# Patient Record
Sex: Female | Born: 2000
Health system: Southern US, Community
[De-identification: ages and names within clinical notes are randomized; demographics above are authoritative.]

## PROBLEM LIST (undated history)

## (undated) DIAGNOSIS — J45909 Unspecified asthma, uncomplicated: Secondary | ICD-10-CM

## (undated) DIAGNOSIS — K5900 Constipation, unspecified: Secondary | ICD-10-CM

---

## 2019-12-28 ENCOUNTER — Ambulatory Visit (HOSPITAL_COMMUNITY)
Admission: EM | Admit: 2019-12-28 | Discharge: 2019-12-28 | Disposition: A | Discharge status: Home / Self Care | Payer: HRSA Program | Attending: Family Medicine | Admitting: Family Medicine

## 2019-12-28 ENCOUNTER — Encounter (HOSPITAL_COMMUNITY): Payer: Self-pay

## 2019-12-28 ENCOUNTER — Other Ambulatory Visit: Payer: Self-pay

## 2019-12-28 DIAGNOSIS — Z20822 Contact with and (suspected) exposure to covid-19: Secondary | ICD-10-CM | POA: Insufficient documentation

## 2019-12-28 DIAGNOSIS — J029 Acute pharyngitis, unspecified: Secondary | ICD-10-CM | POA: Diagnosis not present

## 2019-12-28 DIAGNOSIS — J069 Acute upper respiratory infection, unspecified: Secondary | ICD-10-CM | POA: Diagnosis not present

## 2019-12-28 DIAGNOSIS — R059 Cough, unspecified: Secondary | ICD-10-CM | POA: Diagnosis present

## 2019-12-28 DIAGNOSIS — J3089 Other allergic rhinitis: Secondary | ICD-10-CM | POA: Diagnosis not present

## 2019-12-28 DIAGNOSIS — Z79899 Other long term (current) drug therapy: Secondary | ICD-10-CM | POA: Diagnosis not present

## 2019-12-28 HISTORY — DX: Unspecified asthma, uncomplicated: J45.909

## 2019-12-28 LAB — SARS CORONAVIRUS 2 (TAT 6-24 HRS): SARS Coronavirus 2: NEGATIVE

## 2019-12-28 MED ORDER — FLUTICASONE PROPIONATE 50 MCG/ACT NA SUSP: 1.0000 | Freq: Two times a day (BID) | NASAL | 2 refills | Status: DC

## 2019-12-28 MED ORDER — CETIRIZINE HCL 10 MG PO TABS: 10.0000 mg | ORAL_TABLET | Freq: Every day | ORAL | 1 refills | Status: DC

## 2019-12-28 NOTE — ED Triage Notes (Signed)
Pt in with c/o dry cough and itchy throat that started 2 days ago.   Has not had any meds for sxs  Denies any fever, n/v, diarrhea, sob, headaches

## 2019-12-31 NOTE — ED Provider Notes (Signed)
[facility]    CSN: [id number] Arrival date & time: 12/28/19  1229      History   Chief Complaint Chief Complaint  Patient presents with  . Cough  . Sore Throat    HPI Rebecca Griffin is a 19 y.o. female.   Patient presenting today with 2 day hx of dry cough, itchy scratchy throat, rhinorrhea. Denies fever, chills, CP, SOB, abdominal pain, N/V/D. Not taking anything OTC for sxs currently. Hx of allergic rhinitis and has been out of her antihistamine and flonase for several weeks. No known sick contacts, recent travel.      Past Medical History:  Diagnosis Date  . Asthma     There are no problems to display for this patient.   History reviewed. No pertinent surgical history.  OB History   No obstetric history on file.      Home Medications    Prior to Admission medications   Medication Sig Start Date End Date Taking? Authorizing Provider  ferrous sulfate 325 (65 FE) MG tablet Take by mouth. 06/10/19 06/09/20 Yes [provider]  norelgestromin-ethinyl estradiol [facility]) 150-35 MCG/24HR transdermal patch APPLY 1 PATCH ONCE A WEEK 10/25/19  Yes [provider]  cetirizine (ZYRTEC ALLERGY) 10 MG tablet Take 1 tablet (10 mg total) by mouth daily. 12/28/19   Particia Nearing, PA-C  fluticasone [facility]) 50 MCG/ACT nasal spray Place 1 spray into both nostrils 2 (two) times daily. 12/28/19   Particia Nearing, PA-C    Family History Family History  Problem Relation Age of Onset  . Hypertension Mother     Social History Social History   Tobacco Use  . Smoking status: Never Smoker  . Smokeless tobacco: Never Used  Vaping Use  . Vaping Use: Never used  Substance Use Topics  . Alcohol use: Never  . Drug use: Never     Allergies   Fd&c red #40 [red dye] and Lactose intolerance (gi)   Review of Systems Review of Systems PER HPI    Physical Exam Triage Vital Signs ED Triage Vitals  Enc Vitals Group     BP  12/28/19 1345 129/73     Pulse Rate 12/28/19 1345 79     Resp 12/28/19 1345 20     Temp 12/28/19 1345 98.8 F (37.1 C)     Temp Source 12/28/19 1345 Oral     SpO2 12/28/19 1345 96 %     Weight --      Height --      Head Circumference --      Peak Flow --      Pain Score 12/28/19 1340 5     Pain Loc --      Pain Edu? --      Excl. in GC? --    No data found.  Updated Vital Signs BP 129/73 (BP Location: Right Arm)   Pulse 79   Temp 98.8 F (37.1 C) (Oral)   Resp 20   LMP 12/23/2019 (Approximate)   SpO2 96%   Visual Acuity Right Eye Distance:   Left Eye Distance:   Bilateral Distance:    Right Eye Near:   Left Eye Near:    Bilateral Near:     Physical Exam Vitals and nursing note reviewed.  Constitutional:      Appearance: Normal appearance. She is well-developed. She is not ill-appearing.  HENT:     Head: Atraumatic.     Right Ear: Tympanic membrane normal.  Left Ear: Tympanic membrane normal.     Nose: Rhinorrhea present.     Mouth/Throat:     Mouth: Mucous membranes are moist.     Pharynx: Posterior oropharyngeal erythema present.  Eyes:     Extraocular Movements: Extraocular movements intact.     Conjunctiva/sclera: Conjunctivae normal.  Cardiovascular:     Rate and Rhythm: Normal rate and regular rhythm.     Heart sounds: Normal heart sounds.  Pulmonary:     Effort: Pulmonary effort is normal.     Breath sounds: Normal breath sounds. No wheezing or rales.  Abdominal:     General: Bowel sounds are normal. There is no distension.     Palpations: Abdomen is soft.     Tenderness: There is no abdominal tenderness. There is no guarding.  Musculoskeletal:        General: Normal range of motion.     Cervical back: Normal range of motion and neck supple.  Skin:    General: Skin is warm and dry.  Neurological:     Mental Status: She is alert and oriented to person, place, and time.  Psychiatric:        Mood and Affect: Mood normal.        Thought  Content: Thought content normal.        Judgment: Judgment normal.      UC Treatments / Results  Labs (all labs ordered are listed, but only abnormal results are displayed) Labs Reviewed  SARS CORONAVIRUS 2 (TAT 6-24 HRS)    EKG   Radiology No results found.  Procedures Procedures (including critical care time)  Medications Ordered in UC Medications - No data to display  Initial Impression / Assessment and Plan / UC Course  I have reviewed the triage vital signs and the nursing notes.  Pertinent labs & imaging results that were available during my care of the patient were reviewed by me and considered in my medical decision making (see chart for details).     Suspect allergic rhinitis exacerbation, but will test for COVID for r/o. Isolate until results return and sxs improved. Restart zyrtec and flonase regimen, supportive home care reviewed. Return if worsening sxs.  Final Clinical Impressions(s) / UC Diagnoses   Final diagnoses:  Viral URI with cough  Seasonal allergic rhinitis due to other allergic trigger   Discharge Instructions   None    ED Prescriptions    Medication Sig Dispense Auth. Provider   cetirizine (ZYRTEC ALLERGY) 10 MG tablet Take 1 tablet (10 mg total) by mouth daily. 30 tablet Particia Nearing, PA-C   fluticasone [facility]) 50 MCG/ACT nasal spray Place 1 spray into both nostrils 2 (two) times daily. 16 g Particia Nearing, [place]     PDMP not reviewed this encounter.   Roosvelt Maser Hunter, [place] 12/31/19 [phone]

## 2020-03-18 ENCOUNTER — Other Ambulatory Visit: Payer: Self-pay | Admitting: Family Medicine

## 2020-03-21 DIAGNOSIS — E611 Iron deficiency: Secondary | ICD-10-CM | POA: Diagnosis not present

## 2020-03-21 DIAGNOSIS — Z131 Encounter for screening for diabetes mellitus: Secondary | ICD-10-CM | POA: Diagnosis not present

## 2020-03-21 DIAGNOSIS — L2082 Flexural eczema: Secondary | ICD-10-CM | POA: Diagnosis not present

## 2020-03-21 DIAGNOSIS — Z124 Encounter for screening for malignant neoplasm of cervix: Secondary | ICD-10-CM | POA: Diagnosis not present

## 2020-03-21 DIAGNOSIS — Z113 Encounter for screening for infections with a predominantly sexual mode of transmission: Secondary | ICD-10-CM | POA: Diagnosis not present

## 2020-03-21 DIAGNOSIS — Z0000 Encounter for general adult medical examination without abnormal findings: Secondary | ICD-10-CM | POA: Diagnosis not present

## 2020-03-21 DIAGNOSIS — Z13220 Encounter for screening for lipoid disorders: Secondary | ICD-10-CM | POA: Diagnosis not present

## 2020-03-29 DIAGNOSIS — N9089 Other specified noninflammatory disorders of vulva and perineum: Secondary | ICD-10-CM | POA: Diagnosis not present

## 2020-04-09 ENCOUNTER — Ambulatory Visit (INDEPENDENT_AMBULATORY_CARE_PROVIDER_SITE_OTHER): Payer: 59

## 2020-04-09 ENCOUNTER — Ambulatory Visit (HOSPITAL_COMMUNITY)
Admission: EM | Admit: 2020-04-09 | Discharge: 2020-04-09 | Disposition: A | Discharge status: Home / Self Care | Payer: 59 | Attending: Emergency Medicine | Admitting: Emergency Medicine

## 2020-04-09 ENCOUNTER — Encounter (HOSPITAL_COMMUNITY): Payer: Self-pay

## 2020-04-09 ENCOUNTER — Other Ambulatory Visit: Payer: Self-pay

## 2020-04-09 DIAGNOSIS — R109 Unspecified abdominal pain: Secondary | ICD-10-CM | POA: Diagnosis not present

## 2020-04-09 DIAGNOSIS — K5900 Constipation, unspecified: Secondary | ICD-10-CM

## 2020-04-09 DIAGNOSIS — R197 Diarrhea, unspecified: Secondary | ICD-10-CM | POA: Diagnosis not present

## 2020-04-09 DIAGNOSIS — R110 Nausea: Secondary | ICD-10-CM | POA: Diagnosis not present

## 2020-04-09 LAB — POCT URINALYSIS DIPSTICK, ED / UC
Glucose, UA: NEGATIVE mg/dL
Hgb urine dipstick: NEGATIVE
Leukocytes,Ua: NEGATIVE
Nitrite: NEGATIVE
Protein, ur: 30 mg/dL — AB
Specific Gravity, Urine: 1.025 (ref 1.005–1.030)
Urobilinogen, UA: 1 mg/dL (ref 0.0–1.0)
pH: 7 (ref 5.0–8.0)

## 2020-04-09 MED ORDER — POLYETHYLENE GLYCOL 3350 17 G PO PACK: 17.0000 g | PACK | Freq: Every day | ORAL | 0 refills | Status: DC

## 2020-04-09 NOTE — ED Triage Notes (Signed)
Pt present diarrhea and lower abdominal pain. Symptoms started yesterday. Pt states she had some mild nausea yesterday but it no gone.

## 2020-04-09 NOTE — ED Provider Notes (Signed)
[facility]    CSN: [id number] Arrival date & time: 04/09/20  1502      History   Chief Complaint Chief Complaint  Patient presents with  . Diarrhea  . Abdominal Pain    HPI Rebecca Griffin is a 20 y.o. female.   HPI  Diarrhea: Pt is a slightly poor historian. Pt presents today with diarrhea and lower abdominal pain since yesterday.  She also has some associated nausea but this has resolved as of today.  She states that she has chronic constipation that is fairly severe.  She reports that when she has bowel movements her bowel movements come out as "balls ".  She has not followed or seen for this by a PCP or specialist.  She states that recently she has tried greatly increasing her fiber intake to help with her constipation.  Shortly after this she developed her symptoms.  She states that every time she eats she has a bout of diarrhea.  Diarrhea is nonbloody.  She states that her abdominal pain occurs throughout her abdomen.  She reports that she had a small bowel movement on Friday but is unsure when she had her bowel movement prior to this.  She has not had any fevers, vomiting, urinary symptoms.  Last menstrual cycle was 03/16/2020.    Past Medical History:  Diagnosis Date  . Asthma     There are no problems to display for this patient.   History reviewed. No pertinent surgical history.  OB History   No obstetric history on file.      Home Medications    Prior to Admission medications   Medication Sig Start Date End Date Taking? Authorizing Provider  cetirizine (ZYRTEC ALLERGY) 10 MG tablet Take 1 tablet (10 mg total) by mouth daily. 12/28/19   Particia Nearing, PA-C  ferrous sulfate 325 (65 FE) MG tablet Take by mouth. 06/10/19 06/09/20  [provider]  fluticasone (FLONASE) 50 MCG/ACT nasal spray Place 1 spray into both nostrils 2 (two) times daily. 12/28/19   Particia Nearing, PA-C  norelgestromin-ethinyl estradiol [facility])  150-35 MCG/24HR transdermal patch APPLY 1 PATCH ONCE A WEEK 10/25/19   [provider]    Family History Family History  Problem Relation Age of Onset  . Hypertension Mother     Social History Social History   Tobacco Use  . Smoking status: Never Smoker  . Smokeless tobacco: Never Used  Vaping Use  . Vaping Use: Never used  Substance Use Topics  . Alcohol use: Never  . Drug use: Never     Allergies   Fd&c red #40 [red dye] and Lactose intolerance (gi)   Review of Systems Review of Systems  As stated above in HPI Physical Exam Triage Vital Signs ED Triage Vitals [04/09/20 1553]  Enc Vitals Group     BP 119/66     Pulse Rate 87     Resp 16     Temp 98.2 F (36.8 C)     Temp Source Oral     SpO2 100 %     Weight      Height      Head Circumference      Peak Flow      Pain Score 4     Pain Loc      Pain Edu?      Excl. in GC?    No data found.  Updated Vital Signs BP 119/66 (BP Location: Left Arm)   Pulse  87   Temp 98.2 F (36.8 C) (Oral)   Resp 16   SpO2 100%   Physical Exam Vitals and nursing note reviewed.  Cardiovascular:     Rate and Rhythm: Normal rate and regular rhythm.     Heart sounds: Normal heart sounds.  Pulmonary:     Breath sounds: Normal breath sounds.  Abdominal:     General: Abdomen is flat. Bowel sounds are normal. There is no distension.     Palpations: Abdomen is soft. There is mass (palpable stool burden throughout). There is no hepatomegaly or splenomegaly.     Tenderness: There is no abdominal tenderness. There is no right CVA tenderness, left CVA tenderness, guarding or rebound. Negative signs include Murphy's sign and McBurney's sign.     Hernia: No hernia is present.  Skin:    General: Skin is warm and dry.      UC Treatments / Results  Labs (all labs ordered are listed, but only abnormal results are displayed) Labs Reviewed  POCT URINALYSIS DIPSTICK, ED / UC - Abnormal; Notable for the following  components:      Result Value   Bilirubin Urine SMALL (*)    Ketones, ur TRACE (*)    Protein, ur 30 (*)    All other components within normal limits    EKG   Radiology No results found.  Procedures Procedures (including critical care time)  Medications Ordered in UC Medications - No data to display  Initial Impression / Assessment and Plan / UC Course  I have reviewed the triage vital signs and the nursing notes.  Pertinent labs & imaging results that were available during my care of the patient were reviewed by me and considered in my medical decision making (see chart for details).     New. Likely related to stool burden and chronic constipation with acute change in diet creating increased gas and diarrhea.  I discussed with patient how diarrhea is possible even if constipation is visualized.  KUB pending. We also discussed that she will need to be seen by a primary care provider to evaluate her chronic constipation and will likely need a referral to GI from them.  In the meantime we discussed MiraLAX and stool softener.   Final Clinical Impressions(s) / UC Diagnoses   Final diagnoses:  Diarrhea, unspecified type  Constipation, unspecified constipation type   Discharge Instructions   None    ED Prescriptions    None     PDMP not reviewed this encounter.   Rushie Chestnut, [place] 04/09/20 1703

## 2020-05-18 DIAGNOSIS — R102 Pelvic and perineal pain: Secondary | ICD-10-CM | POA: Diagnosis not present

## 2020-05-25 ENCOUNTER — Other Ambulatory Visit: Payer: Self-pay

## 2020-05-25 ENCOUNTER — Encounter (HOSPITAL_COMMUNITY): Payer: Self-pay

## 2020-05-25 ENCOUNTER — Ambulatory Visit (HOSPITAL_COMMUNITY)
Admission: EM | Admit: 2020-05-25 | Discharge: 2020-05-25 | Disposition: A | Discharge status: Home / Self Care | Payer: 59 | Attending: Family Medicine | Admitting: Family Medicine

## 2020-05-25 DIAGNOSIS — K921 Melena: Secondary | ICD-10-CM | POA: Diagnosis not present

## 2020-05-25 DIAGNOSIS — R109 Unspecified abdominal pain: Secondary | ICD-10-CM | POA: Diagnosis not present

## 2020-05-25 DIAGNOSIS — K5900 Constipation, unspecified: Secondary | ICD-10-CM | POA: Diagnosis not present

## 2020-05-25 HISTORY — DX: Constipation, unspecified: K59.00

## 2020-05-25 MED ORDER — DOCUSATE SODIUM 100 MG PO CAPS: 100.0000 mg | ORAL_CAPSULE | Freq: Two times a day (BID) | ORAL | 0 refills | Status: DC

## 2020-05-25 NOTE — ED Triage Notes (Signed)
Patient presents to Urgent Care with complaints of one episode of blood in stool once last week.  Pt c/o of abdominal cramping and nausea since last week. Pt states she has a hx of constipation and was prescribed Miralax for relief. She states she is having continued constipation.   Denies vomiting.

## 2020-05-25 NOTE — ED Provider Notes (Signed)
[facility]    CSN: [id number] Arrival date & time: 05/25/20  1448      History   Chief Complaint Chief Complaint  Patient presents with  . Blood In Stools    HPI Rebecca Griffin is a 20 y.o. female.   Patient here today with concern over an episode of bright red blood with bowel movement that happened almost a week ago.  She has had some off-and-on abdominal cramping since but overall feeling back to baseline after this 1 episode.  She states the episode occurred after she tried MiraLAX for the first time.  Has an extensive history of fairly severe constipation per her account and has tried numerous supplements and medications without lasting relief for this.  She also has chronic abdominal pain but has never seen a GI specialist for this and has not seen PCP for this in quite some time.  Denies fever, chills, vomiting, unexpected weight loss, dysuria, hematuria, history of hemorrhoids or other anorectal abnormalities.  Has not been trying anything since the MiraLAX which caused the bleeding episode.  She states the bleeding resolved after wiping.     Past Medical History:  Diagnosis Date  . Asthma   . Constipation     There are no problems to display for this patient.   History reviewed. No pertinent surgical history.  OB History   No obstetric history on file.      Home Medications    Prior to Admission medications   Medication Sig Start Date End Date Taking? Authorizing Provider  docusate sodium (COLACE) 100 MG capsule Take 1 capsule (100 mg total) by mouth every 12 (twelve) hours. 05/25/20  Yes Particia Nearing, PA-C  cetirizine (ZYRTEC ALLERGY) 10 MG tablet Take 1 tablet (10 mg total) by mouth daily. 12/28/19   Particia Nearing, PA-C  ferrous sulfate 325 (65 FE) MG tablet Take by mouth. 06/10/19 06/09/20  [provider]  fluticasone (FLONASE) 50 MCG/ACT nasal spray Place 1 spray into both nostrils 2 (two) times daily. 12/28/19    Particia Nearing, PA-C  norelgestromin-ethinyl estradiol [facility]) 150-35 MCG/24HR transdermal patch APPLY 1 PATCH ONCE A WEEK 10/25/19   [provider]  polyethylene glycol (MIRALAX MIX-IN PAX) 17 g packet Take 17 g by mouth daily. 04/09/20   Rushie Chestnut, PA-C    Family History Family History  Problem Relation Age of Onset  . Hypertension Mother     Social History Social History   Tobacco Use  . Smoking status: Never Smoker  . Smokeless tobacco: Never Used  Vaping Use  . Vaping Use: Never used  Substance Use Topics  . Alcohol use: Never  . Drug use: Never     Allergies   Fd&c red #40 [red dye] and Lactose intolerance (gi)   Review of Systems Review of Systems Per HPI  Physical Exam Triage Vital Signs ED Triage Vitals  Enc Vitals Group     BP 05/25/20 1512 106/71     Pulse Rate 05/25/20 1512 84     Resp 05/25/20 1512 16     Temp 05/25/20 1512 98.4 F (36.9 C)     Temp Source 05/25/20 1512 Oral     SpO2 05/25/20 1512 98 %     Weight --      Height --      Head Circumference --      Peak Flow --      Pain Score 05/25/20 1557 0  Pain Loc --      Pain Edu? --      Excl. in GC? --    No data found.  Updated Vital Signs BP 106/71 (BP Location: Left Arm)   Pulse 84   Temp 98.4 F (36.9 C) (Oral)   Resp 16   LMP 05/11/2020   SpO2 98%   Visual Acuity Right Eye Distance:   Left Eye Distance:   Bilateral Distance:    Right Eye Near:   Left Eye Near:    Bilateral Near:     Physical Exam Vitals and nursing note reviewed.  Constitutional:      Appearance: Normal appearance. She is not ill-appearing.  HENT:     Head: Atraumatic.  Eyes:     Extraocular Movements: Extraocular movements intact.     Conjunctiva/sclera: Conjunctivae normal.  Cardiovascular:     Rate and Rhythm: Normal rate and regular rhythm.     Heart sounds: Normal heart sounds.  Pulmonary:     Effort: Pulmonary effort is normal.     Breath sounds: Normal  breath sounds.  Abdominal:     General: Bowel sounds are normal. There is no distension.     Palpations: Abdomen is soft. There is no mass.     Tenderness: There is abdominal tenderness. There is no right CVA tenderness, left CVA tenderness, guarding or rebound.     Comments: Mild tenderness palpation epigastric and suprapubic abdominal areas  Genitourinary:    Comments: Anorectal exam declined today as she states there are no recent abnormalities since one episode of bleeding last week Musculoskeletal:        General: Normal range of motion.     Cervical back: Normal range of motion and neck supple.  Skin:    General: Skin is warm and dry.  Neurological:     Mental Status: She is alert and oriented to person, place, and time.  Psychiatric:        Mood and Affect: Mood normal.        Thought Content: Thought content normal.        Judgment: Judgment normal.      UC Treatments / Results  Labs (all labs ordered are listed, but only abnormal results are displayed) Labs Reviewed - No data to display  EKG   Radiology No results found.  Procedures Procedures (including critical care time)  Medications Ordered in UC Medications - No data to display  Initial Impression / Assessment and Plan / UC Course  I have reviewed the triage vital signs and the nursing notes.  Pertinent labs & imaging results that were available during my care of the patient were reviewed by me and considered in my medical decision making (see chart for details).     Patient has been back to baseline since 1 episode of bright red blood per rectum following a bowel movement/constipation when she tried MiraLAX for the first time.  Continue avoidance of MiraLAX at this point until further evaluation has been done and to her chronic issues.  Suspect inflamed internal hemorrhoids from her chronic constipation causing this 1 episode of bleeding.  May trial Colace, fiber, probiotics, increase fluid intake and  discussed importance of follow-up with PCP and GI specialist for further evaluation and to her ongoing chronic symptoms.  Return to urgent care or ED for acutely worsening symptoms at any time.  Work note given.  Final Clinical Impressions(s) / UC Diagnoses   Final diagnoses:  Constipation, unspecified constipation type  Blood in stool  Abdominal cramping     Discharge Instructions     Follow-up with your primary care provider as soon as you are able to for further investigation into your chronic constipation issues    ED Prescriptions    Medication Sig Dispense Auth. Provider   docusate sodium (COLACE) 100 MG capsule Take 1 capsule (100 mg total) by mouth every 12 (twelve) hours. 60 capsule Particia Nearing, [place]     PDMP not reviewed this encounter.   Particia Nearing, [place] 05/25/20 [phone]

## 2020-05-25 NOTE — Discharge Instructions (Signed)
Follow-up with your primary care provider as soon as you are able to for further investigation into your chronic constipation issues

## 2020-05-27 ENCOUNTER — Emergency Department (HOSPITAL_BASED_OUTPATIENT_CLINIC_OR_DEPARTMENT_OTHER)
Admission: EM | Admit: 2020-05-27 | Discharge: 2020-05-27 | Disposition: A | Discharge status: Home / Self Care | Payer: 59 | Attending: Emergency Medicine | Admitting: Emergency Medicine

## 2020-05-27 ENCOUNTER — Encounter (HOSPITAL_BASED_OUTPATIENT_CLINIC_OR_DEPARTMENT_OTHER): Payer: Self-pay | Admitting: Emergency Medicine

## 2020-05-27 ENCOUNTER — Other Ambulatory Visit: Payer: Self-pay

## 2020-05-27 ENCOUNTER — Emergency Department (HOSPITAL_BASED_OUTPATIENT_CLINIC_OR_DEPARTMENT_OTHER): Payer: 59

## 2020-05-27 DIAGNOSIS — K625 Hemorrhage of anus and rectum: Secondary | ICD-10-CM | POA: Insufficient documentation

## 2020-05-27 DIAGNOSIS — R109 Unspecified abdominal pain: Secondary | ICD-10-CM | POA: Diagnosis not present

## 2020-05-27 DIAGNOSIS — R1030 Lower abdominal pain, unspecified: Secondary | ICD-10-CM | POA: Insufficient documentation

## 2020-05-27 DIAGNOSIS — B9689 Other specified bacterial agents as the cause of diseases classified elsewhere: Secondary | ICD-10-CM

## 2020-05-27 DIAGNOSIS — Z8719 Personal history of other diseases of the digestive system: Secondary | ICD-10-CM | POA: Diagnosis not present

## 2020-05-27 DIAGNOSIS — J45909 Unspecified asthma, uncomplicated: Secondary | ICD-10-CM | POA: Diagnosis not present

## 2020-05-27 LAB — COMPREHENSIVE METABOLIC PANEL
ALT: 8 U/L (ref 0–44)
AST: 19 U/L (ref 15–41)
Albumin: 4.4 g/dL (ref 3.5–5.0)
Alkaline Phosphatase: 52 U/L (ref 38–126)
Anion gap: 10 (ref 5–15)
BUN: 9 mg/dL (ref 6–20)
CO2: 23 mmol/L (ref 22–32)
Calcium: 9.3 mg/dL (ref 8.9–10.3)
Chloride: 104 mmol/L (ref 98–111)
Creatinine, Ser: 0.57 mg/dL (ref 0.44–1.00)
GFR, Estimated: >60 mL/min (ref >60)
Glucose, Bld: 86 mg/dL (ref 70–99)
Potassium: 5.4 mmol/L — ABNORMAL HIGH (ref 3.5–5.1)
Sodium: 137 mmol/L (ref 135–145)
Total Bilirubin: 0.5 mg/dL (ref 0.3–1.2)
Total Protein: 7.7 g/dL (ref 6.5–8.1)

## 2020-05-27 LAB — CBC WITH DIFFERENTIAL/PLATELET
Abs Immature Granulocytes: 0.01 10*3/uL (ref 0.00–0.07)
Basophils Absolute: 0.1 10*3/uL (ref 0.0–0.1)
Basophils Relative: 2 %
Eosinophils Absolute: 0.3 10*3/uL (ref 0.0–0.5)
Eosinophils Relative: 6 %
HCT: 40.5 % (ref 36.0–46.0)
Hemoglobin: 13.7 g/dL (ref 12.0–15.0)
Immature Granulocytes: 0 %
Lymphocytes Relative: 51 %
Lymphs Abs: 2.6 10*3/uL (ref 0.7–4.0)
MCH: 28.4 pg (ref 26.0–34.0)
MCHC: 33.8 g/dL (ref 30.0–36.0)
MCV: 83.9 fL (ref 80.0–100.0)
Monocytes Absolute: 0.4 10*3/uL (ref 0.1–1.0)
Monocytes Relative: 7 %
Neutro Abs: 1.8 10*3/uL (ref 1.7–7.7)
Neutrophils Relative %: 34 %
Platelets: 310 10*3/uL (ref 150–400)
RBC: 4.83 MIL/uL (ref 3.87–5.11)
RDW: 14 % (ref 11.5–15.5)
WBC: 5.2 10*3/uL (ref 4.0–10.5)
nRBC: 0 % (ref 0.0–0.2)

## 2020-05-27 LAB — WET PREP, GENITAL
Sperm: NONE SEEN
Trich, Wet Prep: NONE SEEN
Yeast Wet Prep HPF POC: NONE SEEN

## 2020-05-27 LAB — URINALYSIS, ROUTINE W REFLEX MICROSCOPIC
Bilirubin Urine: NEGATIVE
Glucose, UA: NEGATIVE mg/dL
Hgb urine dipstick: NEGATIVE
Ketones, ur: NEGATIVE mg/dL
Leukocytes,Ua: NEGATIVE
Nitrite: NEGATIVE
Specific Gravity, Urine: 1.027 (ref 1.005–1.030)
pH: 5.5 (ref 5.0–8.0)

## 2020-05-27 LAB — LIPASE, BLOOD: Lipase: 23 U/L (ref 11–51)

## 2020-05-27 LAB — PREGNANCY, URINE: Preg Test, Ur: NEGATIVE

## 2020-05-27 MED ORDER — SODIUM CHLORIDE 0.9 % IV BOLUS
500.0000 mL | Freq: Once | INTRAVENOUS | Status: AC
  Administered 2020-05-27: 500 mL via INTRAVENOUS

## 2020-05-27 MED ORDER — METRONIDAZOLE 500 MG PO TABS: 500.0000 mg | ORAL_TABLET | Freq: Two times a day (BID) | ORAL | 0 refills | Status: AC

## 2020-05-27 MED ORDER — ONDANSETRON HCL 4 MG/2ML IJ SOLN
4.0000 mg | Freq: Once | INTRAMUSCULAR | Status: DC
  Filled 2020-05-27: qty 2

## 2020-05-27 MED ORDER — IOHEXOL 300 MG/ML  SOLN
100.0000 mL | Freq: Once | INTRAMUSCULAR | Status: AC | PRN
  Administered 2020-05-27: 80 mL via INTRAVENOUS

## 2020-05-27 NOTE — ED Triage Notes (Signed)
  Patient comes in with lower abdominal pain that has been going on for a few days.  Patient states she has a hx of constipation and uses miralax.  Patient states earlier in the week she had a large amount of blood in one of her stools.  Had BM around 2300 last night and was solid and normal color.  No urinary symptoms or discharge.  Pain 5/10.

## 2020-05-27 NOTE — ED Notes (Signed)
Pt c/o nausea then stated it had passed when meds were offered. Pt stating she feels full, encouraged to continue to drink the contrast liquid.

## 2020-05-27 NOTE — ED Provider Notes (Signed)
[facility] EMERGENCY DEPT Provider Note   CSN: [id number] Arrival date & time: 05/27/20  0455     History Chief Complaint  Patient presents with  . Abdominal Pain    Rebecca Griffin is a 20 y.o. female.  Patient presents to the emergency department with complaints of abdominal pain.  Patient reports that she has been having central low abdominal pain intermittently for a couple of weeks.  She does have a history of chronic constipation since she was a child.  She reports that a week ago she had a bowel movement and there was a large amount of blood mixed with small balls of stool that she had to strain to get out.  She went to urgent care and was treated for constipation with MiraLAX.  She does report that she had a normal bowel movement yesterday, no bleeding but has been experiencing pain through the night.        Past Medical History:  Diagnosis Date  . Asthma   . Constipation     There are no problems to display for this patient.   History reviewed. No pertinent surgical history.   OB History   No obstetric history on file.     Family History  Problem Relation Age of Onset  . Hypertension Mother     Social History   Tobacco Use  . Smoking status: Never Smoker  . Smokeless tobacco: Never Used  Vaping Use  . Vaping Use: Never used  Substance Use Topics  . Alcohol use: Never  . Drug use: Never    Home Medications Prior to Admission medications   Medication Sig Start Date End Date Taking? Authorizing Provider  cetirizine (ZYRTEC ALLERGY) 10 MG tablet Take 1 tablet (10 mg total) by mouth daily. 12/28/19   Particia Nearing, PA-C  docusate sodium (COLACE) 100 MG capsule Take 1 capsule (100 mg total) by mouth every 12 (twelve) hours. 05/25/20   Particia Nearing, PA-C  ferrous sulfate 325 (65 FE) MG tablet Take by mouth. 06/10/19 06/09/20  [provider]  fluticasone (FLONASE) 50 MCG/ACT nasal spray Place 1 spray into both  nostrils 2 (two) times daily. 12/28/19   Particia Nearing, PA-C  norelgestromin-ethinyl estradiol [employer]) 150-35 MCG/24HR transdermal patch APPLY 1 PATCH ONCE A WEEK 10/25/19   [provider]  polyethylene glycol (MIRALAX MIX-IN PAX) 17 g packet Take 17 g by mouth daily. 04/09/20   Rushie Chestnut, PA-C    Allergies    Fd&c red #40 [red dye] and Lactose intolerance (gi)  Review of Systems   Review of Systems  Gastrointestinal: Positive for abdominal pain, blood in stool and constipation.  All other systems reviewed and are negative.   Physical Exam Updated Vital Signs BP 123/78 (BP Location: Left Arm)   Pulse 85   Temp 98.3 F (36.8 C) (Oral)   Resp 18   Ht 5' 3.5" (1.613 m)   Wt 49.9 kg   LMP 05/11/2020   SpO2 100%   BMI 19.18 kg/m   Physical Exam Vitals and nursing note reviewed. Exam conducted with a chaperone present.  Constitutional:      General: She is not in acute distress.    Appearance: Normal appearance. She is well-developed.  HENT:     Head: Normocephalic and atraumatic.     Right Ear: Hearing normal.     Left Ear: Hearing normal.     Nose: Nose normal.  Eyes:     Conjunctiva/sclera: Conjunctivae normal.  Pupils: Pupils are equal, round, and reactive to light.  Cardiovascular:     Rate and Rhythm: Regular rhythm.     Heart sounds: S1 normal and S2 normal. No murmur heard. No friction rub. No gallop.   Pulmonary:     Effort: Pulmonary effort is normal. No respiratory distress.     Breath sounds: Normal breath sounds.  Chest:     Chest wall: No tenderness.  Abdominal:     General: Bowel sounds are normal.     Palpations: Abdomen is soft.     Tenderness: There is abdominal tenderness in the periumbilical area and suprapubic area. There is no guarding or rebound. Negative signs include Murphy's sign and McBurney's sign.     Hernia: No hernia is present. There is no hernia in the left inguinal area or right inguinal area.   Genitourinary:    General: Normal vulva.     Exam position: Supine.     Vagina: Normal.     Cervix: Normal.     Uterus: Normal.      Adnexa: Right adnexa normal and left adnexa normal.  Musculoskeletal:        General: Normal range of motion.     Cervical back: Normal range of motion and neck supple.  Lymphadenopathy:     Lower Body: No right inguinal adenopathy. No left inguinal adenopathy.  Skin:    General: Skin is warm and dry.     Findings: No rash.  Neurological:     Mental Status: She is alert and oriented to person, place, and time.     GCS: GCS eye subscore is 4. GCS verbal subscore is 5. GCS motor subscore is 6.     Cranial Nerves: No cranial nerve deficit.     Sensory: No sensory deficit.     Coordination: Coordination normal.  Psychiatric:        Speech: Speech normal.        Behavior: Behavior normal.        Thought Content: Thought content normal.     ED Results / Procedures / Treatments   Labs (all labs ordered are listed, but only abnormal results are displayed) Labs Reviewed  WET PREP, GENITAL  CBC WITH DIFFERENTIAL/PLATELET  COMPREHENSIVE METABOLIC PANEL  LIPASE, BLOOD  URINALYSIS, ROUTINE W REFLEX MICROSCOPIC  PREGNANCY, URINE  GC/CHLAMYDIA PROBE AMP ([place]) NOT AT [facility]    EKG None  Radiology No results found.  Procedures Procedures   Medications Ordered in ED Medications - No data to display  ED Course  I have reviewed the triage vital signs and the nursing notes.  Pertinent labs & imaging results that were available during my care of the patient were reviewed by me and considered in my medical decision making (see chart for details).    MDM Rules/Calculators/A&P                          Patient presents to the emergency department for evaluation of lower abdominal pain with rectal bleeding.  Patient has a history of chronic constipation.  When she did have a rectal bleeding it was associated with a hard small stools and likely  was related to constipation.  She was placed on MiraLAX by urgent care but continues to have the abdominal pain despite having a normal bowel movement yesterday.  Examination is nonfocal but she does have moderate amount of lower abdominal tenderness diffusely.  Pelvic exam unremarkable.  Basic labs reassuring  but based on symptoms, will perform CT scan to rule out colitis, etc.  Final Clinical Impression(s) / ED Diagnoses Final diagnoses:  Lower abdominal pain    Rx / DC Orders ED Discharge Orders    None       Dequandre Cordova, Canary Brim, MD 05/27/20 [phone]

## 2020-05-27 NOTE — ED Provider Notes (Signed)
Patient here with abdominal pain.  Plan is for CT scan abdomen pelvis.  Work-up thus far is unremarkable.  Positive for clue cells but asymptomatic.  Has history of constipation.  But will rule out other intra-abdominal process such as appendicitis.  CT scan is normal.  Is having some discharge and will treat for Flagyl.  Patient has had pain ongoing for several weeks to months.  No concern for torsion or ovarian pathology.  No large stool burden.  Overall suspect some chronic type abdominal pain.  Will refer her to gastroenterology if she continues to have persistent pain to evaluate for any type of need for colonoscopy or other further GI work-up.  Discharged in good condition.  Recommend follow-up with primary care doctor as well.  This chart was dictated using voice recognition software.  Despite best efforts to proofread,  errors can occur which can change the documentation meaning.     Virgina Norfolk, DO 05/27/20 [phone]

## 2020-05-27 NOTE — ED Notes (Signed)
Tx to transported to CT via W/C

## 2020-05-27 NOTE — ED Notes (Addendum)
Mother at the bedside. Patient changed into a gown. Attached to monitor.

## 2020-05-27 NOTE — ED Notes (Signed)
Ambulated to BR.

## 2020-05-27 NOTE — ED Notes (Signed)
Ambulated to bathroom without difficulty

## 2020-05-28 LAB — GC/CHLAMYDIA PROBE AMP (~~LOC~~) NOT AT ARMC
Chlamydia: NEGATIVE
Comment: NEGATIVE
Comment: NORMAL
Neisseria Gonorrhea: NEGATIVE

## 2020-05-29 DIAGNOSIS — K625 Hemorrhage of anus and rectum: Secondary | ICD-10-CM | POA: Diagnosis not present

## 2020-05-29 DIAGNOSIS — K5900 Constipation, unspecified: Secondary | ICD-10-CM | POA: Diagnosis not present

## 2020-05-29 DIAGNOSIS — R1033 Periumbilical pain: Secondary | ICD-10-CM | POA: Diagnosis not present

## 2020-08-15 DIAGNOSIS — R109 Unspecified abdominal pain: Secondary | ICD-10-CM | POA: Diagnosis not present

## 2020-08-15 DIAGNOSIS — K5900 Constipation, unspecified: Secondary | ICD-10-CM | POA: Diagnosis not present

## 2020-08-15 DIAGNOSIS — K219 Gastro-esophageal reflux disease without esophagitis: Secondary | ICD-10-CM | POA: Diagnosis not present

## 2020-08-15 DIAGNOSIS — K625 Hemorrhage of anus and rectum: Secondary | ICD-10-CM | POA: Diagnosis not present

## 2020-08-17 DIAGNOSIS — K5900 Constipation, unspecified: Secondary | ICD-10-CM | POA: Diagnosis not present

## 2020-08-17 DIAGNOSIS — K219 Gastro-esophageal reflux disease without esophagitis: Secondary | ICD-10-CM | POA: Diagnosis not present

## 2020-08-17 DIAGNOSIS — K625 Hemorrhage of anus and rectum: Secondary | ICD-10-CM | POA: Diagnosis not present

## 2020-08-17 DIAGNOSIS — R634 Abnormal weight loss: Secondary | ICD-10-CM | POA: Diagnosis not present

## 2020-08-17 DIAGNOSIS — R109 Unspecified abdominal pain: Secondary | ICD-10-CM | POA: Diagnosis not present

## 2020-09-08 DIAGNOSIS — R109 Unspecified abdominal pain: Secondary | ICD-10-CM | POA: Diagnosis not present

## 2020-09-08 DIAGNOSIS — K219 Gastro-esophageal reflux disease without esophagitis: Secondary | ICD-10-CM | POA: Diagnosis not present

## 2020-09-08 DIAGNOSIS — K625 Hemorrhage of anus and rectum: Secondary | ICD-10-CM | POA: Diagnosis not present

## 2020-09-08 DIAGNOSIS — R634 Abnormal weight loss: Secondary | ICD-10-CM | POA: Diagnosis not present

## 2020-09-08 DIAGNOSIS — K5900 Constipation, unspecified: Secondary | ICD-10-CM | POA: Diagnosis not present

## 2021-01-09 DIAGNOSIS — R102 Pelvic and perineal pain: Secondary | ICD-10-CM | POA: Diagnosis not present

## 2021-01-09 DIAGNOSIS — N760 Acute vaginitis: Secondary | ICD-10-CM | POA: Diagnosis not present

## 2021-01-09 DIAGNOSIS — R1032 Left lower quadrant pain: Secondary | ICD-10-CM | POA: Diagnosis not present

## 2021-01-09 DIAGNOSIS — R1031 Right lower quadrant pain: Secondary | ICD-10-CM | POA: Diagnosis not present

## 2021-01-09 DIAGNOSIS — Z113 Encounter for screening for infections with a predominantly sexual mode of transmission: Secondary | ICD-10-CM | POA: Diagnosis not present

## 2021-01-09 DIAGNOSIS — R350 Frequency of micturition: Secondary | ICD-10-CM | POA: Diagnosis not present

## 2021-03-27 DIAGNOSIS — K5909 Other constipation: Secondary | ICD-10-CM | POA: Diagnosis not present

## 2021-03-27 DIAGNOSIS — K219 Gastro-esophageal reflux disease without esophagitis: Secondary | ICD-10-CM | POA: Diagnosis not present

## 2021-03-28 DIAGNOSIS — Z23 Encounter for immunization: Secondary | ICD-10-CM | POA: Diagnosis not present

## 2021-03-28 DIAGNOSIS — Z124 Encounter for screening for malignant neoplasm of cervix: Secondary | ICD-10-CM | POA: Diagnosis not present

## 2021-03-28 DIAGNOSIS — Z0000 Encounter for general adult medical examination without abnormal findings: Secondary | ICD-10-CM | POA: Diagnosis not present

## 2021-03-28 DIAGNOSIS — Z113 Encounter for screening for infections with a predominantly sexual mode of transmission: Secondary | ICD-10-CM | POA: Diagnosis not present

## 2021-03-28 DIAGNOSIS — L2082 Flexural eczema: Secondary | ICD-10-CM | POA: Diagnosis not present

## 2021-04-06 DIAGNOSIS — Z113 Encounter for screening for infections with a predominantly sexual mode of transmission: Secondary | ICD-10-CM | POA: Diagnosis not present

## 2021-04-06 DIAGNOSIS — Z131 Encounter for screening for diabetes mellitus: Secondary | ICD-10-CM | POA: Diagnosis not present

## 2021-04-06 DIAGNOSIS — Z114 Encounter for screening for human immunodeficiency virus [HIV]: Secondary | ICD-10-CM | POA: Diagnosis not present

## 2021-04-06 DIAGNOSIS — Z0000 Encounter for general adult medical examination without abnormal findings: Secondary | ICD-10-CM | POA: Diagnosis not present

## 2021-04-06 DIAGNOSIS — Z13220 Encounter for screening for lipoid disorders: Secondary | ICD-10-CM | POA: Diagnosis not present

## 2021-04-26 DIAGNOSIS — H52223 Regular astigmatism, bilateral: Secondary | ICD-10-CM | POA: Diagnosis not present

## 2021-06-14 DIAGNOSIS — Z8639 Personal history of other endocrine, nutritional and metabolic disease: Secondary | ICD-10-CM | POA: Diagnosis not present

## 2021-06-14 DIAGNOSIS — R5383 Other fatigue: Secondary | ICD-10-CM | POA: Diagnosis not present

## 2021-06-29 ENCOUNTER — Other Ambulatory Visit (HOSPITAL_COMMUNITY): Payer: Self-pay

## 2021-06-30 DIAGNOSIS — F411 Generalized anxiety disorder: Secondary | ICD-10-CM | POA: Diagnosis not present

## 2021-06-30 DIAGNOSIS — F329 Major depressive disorder, single episode, unspecified: Secondary | ICD-10-CM | POA: Diagnosis not present

## 2021-07-02 ENCOUNTER — Other Ambulatory Visit (HOSPITAL_COMMUNITY): Payer: Self-pay

## 2021-07-02 MED ORDER — LANSOPRAZOLE 30 MG PO CPDR
30.0000 mg | DELAYED_RELEASE_CAPSULE | Freq: Every day | ORAL | 10 refills | Status: DC
  Filled 2022-03-26 – 2022-04-09 (×2): qty 30, 30d supply, fill #0

## 2021-07-02 MED ORDER — TRIAMCINOLONE ACETONIDE 0.5 % EX CREA: TOPICAL_CREAM | CUTANEOUS | 1 refills | Status: DC

## 2021-07-02 MED ORDER — PANTOPRAZOLE SODIUM 40 MG PO TBEC
40.0000 mg | DELAYED_RELEASE_TABLET | Freq: Every day | ORAL | 11 refills | Status: DC
  Filled 2021-07-02 (×2): qty 30, 30d supply, fill #0

## 2021-07-02 MED ORDER — NORELGESTROMIN-ETH ESTRADIOL 150-35 MCG/24HR TD PTWK
MEDICATED_PATCH | TRANSDERMAL | 7 refills | Status: DC
  Filled 2021-07-17: qty 3, 28d supply, fill #0
  Filled 2021-08-22: qty 3, 28d supply, fill #1
  Filled 2021-09-26: qty 3, 28d supply, fill #2
  Filled 2021-10-19: qty 9, 84d supply, fill #3
  Filled 2022-01-09: qty 6, 56d supply, fill #4

## 2021-07-17 ENCOUNTER — Other Ambulatory Visit (HOSPITAL_COMMUNITY): Payer: Self-pay

## 2021-07-17 DIAGNOSIS — R399 Unspecified symptoms and signs involving the genitourinary system: Secondary | ICD-10-CM | POA: Diagnosis not present

## 2021-07-17 MED ORDER — ESOMEPRAZOLE MAGNESIUM 40 MG PO CPDR
DELAYED_RELEASE_CAPSULE | ORAL | 3 refills | Status: DC
  Filled 2021-07-17: qty 30, 30d supply, fill #0

## 2021-07-18 ENCOUNTER — Other Ambulatory Visit (HOSPITAL_COMMUNITY): Payer: Self-pay

## 2021-07-24 ENCOUNTER — Other Ambulatory Visit (HOSPITAL_COMMUNITY): Payer: Self-pay

## 2021-07-24 DIAGNOSIS — N898 Other specified noninflammatory disorders of vagina: Secondary | ICD-10-CM | POA: Diagnosis not present

## 2021-07-24 MED ORDER — METRONIDAZOLE 0.75 % VA GEL
VAGINAL | 0 refills | Status: DC
  Filled 2021-07-24: qty 70, 5d supply, fill #0

## 2021-08-13 ENCOUNTER — Other Ambulatory Visit (HOSPITAL_COMMUNITY): Payer: Self-pay

## 2021-08-19 DIAGNOSIS — F329 Major depressive disorder, single episode, unspecified: Secondary | ICD-10-CM | POA: Diagnosis not present

## 2021-08-19 DIAGNOSIS — F411 Generalized anxiety disorder: Secondary | ICD-10-CM | POA: Diagnosis not present

## 2021-08-22 ENCOUNTER — Other Ambulatory Visit (HOSPITAL_COMMUNITY): Payer: Self-pay

## 2021-08-29 ENCOUNTER — Ambulatory Visit
Admission: RE | Admit: 2021-08-29 | Discharge: 2021-08-29 | Disposition: A | Discharge status: Home / Self Care | Payer: 59 | Attending: Emergency Medicine | Admitting: Emergency Medicine

## 2021-08-29 ENCOUNTER — Encounter: Payer: Self-pay | Admitting: Emergency Medicine

## 2021-08-29 ENCOUNTER — Ambulatory Visit
Admission: EM | Admit: 2021-08-29 | Discharge: 2021-08-29 | Disposition: A | Discharge status: Home / Self Care | Payer: 59 | Attending: Emergency Medicine | Admitting: Emergency Medicine

## 2021-08-29 ENCOUNTER — Ambulatory Visit
Admission: RE | Admit: 2021-08-29 | Discharge: 2021-08-29 | Disposition: A | Discharge status: Home / Self Care | Payer: 59 | Source: Ambulatory Visit | Attending: Emergency Medicine | Admitting: Emergency Medicine

## 2021-08-29 DIAGNOSIS — M25571 Pain in right ankle and joints of right foot: Secondary | ICD-10-CM | POA: Insufficient documentation

## 2021-08-29 DIAGNOSIS — W208XXA Other cause of strike by thrown, projected or falling object, initial encounter: Secondary | ICD-10-CM | POA: Diagnosis not present

## 2021-08-29 DIAGNOSIS — S99911A Unspecified injury of right ankle, initial encounter: Secondary | ICD-10-CM | POA: Diagnosis not present

## 2021-08-29 NOTE — Discharge Instructions (Addendum)
Go to [facility] for your x-ray.    [address]  [address] [place], [place] [address] [phone]  I will call you with the results.   

## 2021-08-29 NOTE — ED Provider Notes (Signed)
Renaldo Fiddler    CSN: [id number] Arrival date & time: 08/29/21  1457      History   Chief Complaint Chief Complaint  Patient presents with   Ankle Pain    HPI Rebecca Griffin is a 21 y.o. female.   Patient presents with pain in her right ankle x 4 days since a speaker was dropped on it accidentally.  The pain is worse with weight bearing and ambulation.  She denies numbness, weakness, paresthesias, wounds, redness, bruising, swelling, or other symptoms.  Treatment at home with ankle wrap (coban).  Her medical history includes asthma.  LMP 3 weeks; She denies current pregnancy or breastfeeding.     The history is provided by the patient and medical records.    Past Medical History:  Diagnosis Date   Asthma    Constipation     There are no problems to display for this patient.   History reviewed. No pertinent surgical history.  OB History   No obstetric history on file.      Home Medications    Prior to Admission medications   Medication Sig Start Date End Date Taking? Authorizing Provider  cetirizine (ZYRTEC ALLERGY) 10 MG tablet Take 1 tablet by mouth once daily. 03/27/21     docusate sodium (COLACE) 100 MG capsule Take 1 capsule (100 mg total) by mouth every 12 (twelve) hours. 05/25/20   Particia Nearing, PA-C  esomeprazole (NEXIUM) 40 MG capsule Take 1 capsule by mouth 15-20 minutes before breakfast 07/05/21     ferrous sulfate 325 (65 FE) MG tablet Take by mouth. 06/10/19 06/09/20  [provider]  fluticasone (FLONASE) 50 MCG/ACT nasal spray Place 1 spray into both nostrils 2 (two) times daily. 12/28/19   Particia Nearing, PA-C  lansoprazole (PREVACID) 30 MG capsule Take 1 capsule by mouth daily. 04/09/21     metroNIDAZOLE (METROGEL) 0.75 % vaginal gel Insert 1 applicatorful vaginally 2 (two) times daily for 5 days (no alcohol) 07/24/21     norelgestromin-ethinyl estradiol [employer]ico) 150-35 MCG/24HR transdermal patch APPLY 1 PATCH ONCE A  WEEK 10/25/19   [provider]  norelgestromin-ethinyl estradiol [employer]ico) 150-35 MCG/24HR transdermal patch Place 1 patch onto the skin every 7 days 06/14/21     pantoprazole (PROTONIX) 40 MG tablet Take 1 tablet by mouth daily. 06/14/21     polyethylene glycol (MIRALAX MIX-IN PAX) 17 g packet Take 17 g by mouth daily. 04/09/20   Rushie Chestnut, PA-C  triamcinolone cream (KENALOG) 0.5 % Apply to affected area 2 times a day 03/28/21       Family History Family History  Problem Relation Age of Onset   Hypertension Mother     Social History Social History   Tobacco Use   Smoking status: Never   Smokeless tobacco: Never  Vaping Use   Vaping Use: Never used  Substance Use Topics   Alcohol use: Never   Drug use: Never     Allergies   Fd&c red #40 [red dye] and Lactose intolerance (gi)   Review of Systems Review of Systems  Constitutional:  Negative for chills and fever.  Musculoskeletal:  Positive for arthralgias and gait problem. Negative for joint swelling.  Skin:  Negative for color change, rash and wound.  Neurological:  Negative for weakness and numbness.  All other systems reviewed and are negative.    Physical Exam Triage Vital Signs ED Triage Vitals [08/29/21 1500]  Enc Vitals Group     BP  Pulse      Resp      Temp      Temp src      SpO2      Weight      Height      Head Circumference      Peak Flow      Pain Score 6     Pain Loc      Pain Edu?      Excl. in GC?    No data found.  Updated Vital Signs BP 117/68   Pulse 71   Temp 98.1 F (36.7 C)   Resp 20   LMP 08/07/2021   SpO2 98%   Visual Acuity Right Eye Distance:   Left Eye Distance:   Bilateral Distance:    Right Eye Near:   Left Eye Near:    Bilateral Near:     Physical Exam Vitals and nursing note reviewed.  Constitutional:      General: She is not in acute distress.    Appearance: Normal appearance. She is well-developed. She is not ill-appearing.  HENT:      Mouth/Throat:     Mouth: Mucous membranes are moist.  Cardiovascular:     Rate and Rhythm: Normal rate and regular rhythm.     Heart sounds: Normal heart sounds.  Pulmonary:     Effort: Pulmonary effort is normal. No respiratory distress.     Breath sounds: Normal breath sounds.  Musculoskeletal:        General: No swelling, tenderness, deformity or signs of injury. Normal range of motion.     Cervical back: Neck supple.       Feet:  Skin:    General: Skin is warm and dry.     Capillary Refill: Capillary refill takes less than 2 seconds.     Findings: No bruising, erythema, lesion or rash.  Neurological:     General: No focal deficit present.     Mental Status: She is alert and oriented to person, place, and time.     Sensory: No sensory deficit.     Motor: No weakness.     Gait: Gait abnormal.     Comments: Limping gait.  Psychiatric:        Mood and Affect: Mood normal.        Behavior: Behavior normal.      UC Treatments / Results  Labs (all labs ordered are listed, but only abnormal results are displayed) Labs Reviewed - No data to display  EKG   Radiology DG Ankle Complete Right  Result Date: 08/29/2021 CLINICAL DATA:  Blunt trauma EXAM: RIGHT ANKLE - COMPLETE 3+ VIEW COMPARISON:  None Available. FINDINGS: Ankle mortise intact. The talar dome is normal. No malleolar fracture. The calcaneus is normal. IMPRESSION: No fracture or dislocation. Electronically Signed   By: Genevive Bi M.D.   On: 08/29/2021 16:04    Procedures Procedures (including critical care time)  Medications Ordered in UC Medications - No data to display  Initial Impression / Assessment and Plan / UC Course  I have reviewed the triage vital signs and the nursing notes.  Pertinent labs & imaging results that were available during my care of the patient were reviewed by me and considered in my medical decision making (see chart for details).   Right ankle pain.  Xray negative.  Treating  with rest, elevation, ice packs, Tylenol or ibuprofen, ace wrap.  Instructed patient to follow up with ortho if her symptoms are not  improving.  Contact information for on call ortho provided.  She agrees to plan of care.     Final Clinical Impressions(s) / UC Diagnoses   Final diagnoses:  Acute right ankle pain     Discharge Instructions      Go to [facility] for your x-ray.    [address]  [address] [place], [place] [address] [phone]  I will call you with the results.       ED Prescriptions   None    PDMP not reviewed this encounter.   Mickie Bail, NP 08/29/21 [phone]

## 2021-08-29 NOTE — ED Triage Notes (Signed)
Pt here with right ankle pain after mother accidentally dropped a speaker on it.

## 2021-09-26 ENCOUNTER — Other Ambulatory Visit (HOSPITAL_COMMUNITY): Payer: Self-pay

## 2021-09-27 DIAGNOSIS — F329 Major depressive disorder, single episode, unspecified: Secondary | ICD-10-CM | POA: Diagnosis not present

## 2021-09-27 DIAGNOSIS — F411 Generalized anxiety disorder: Secondary | ICD-10-CM | POA: Diagnosis not present

## 2021-10-05 DIAGNOSIS — E559 Vitamin D deficiency, unspecified: Secondary | ICD-10-CM | POA: Diagnosis not present

## 2021-10-19 ENCOUNTER — Other Ambulatory Visit (HOSPITAL_COMMUNITY): Payer: Self-pay

## 2021-11-21 ENCOUNTER — Other Ambulatory Visit (HOSPITAL_COMMUNITY): Payer: Self-pay

## 2021-11-21 DIAGNOSIS — J019 Acute sinusitis, unspecified: Secondary | ICD-10-CM | POA: Diagnosis not present

## 2021-11-21 DIAGNOSIS — B9689 Other specified bacterial agents as the cause of diseases classified elsewhere: Secondary | ICD-10-CM | POA: Diagnosis not present

## 2021-11-21 DIAGNOSIS — J209 Acute bronchitis, unspecified: Secondary | ICD-10-CM | POA: Diagnosis not present

## 2021-11-21 DIAGNOSIS — Z03818 Encounter for observation for suspected exposure to other biological agents ruled out: Secondary | ICD-10-CM | POA: Diagnosis not present

## 2021-11-21 MED ORDER — AZITHROMYCIN 200 MG/5ML PO SUSR
ORAL | 0 refills | Status: DC
  Filled 2021-11-21: qty 60, 15d supply, fill #0

## 2021-11-21 MED ORDER — PREDNISOLONE SODIUM PHOSPHATE 15 MG/5ML PO SOLN
ORAL | 0 refills | Status: DC
  Filled 2021-11-21: qty 50, 5d supply, fill #0

## 2021-11-21 MED ORDER — PROMETHAZINE-DM 6.25-15 MG/5ML PO SYRP
ORAL_SOLUTION | ORAL | 0 refills | Status: DC
  Filled 2021-11-21: qty 60, 4d supply, fill #0

## 2021-11-22 ENCOUNTER — Other Ambulatory Visit (HOSPITAL_COMMUNITY): Payer: Self-pay

## 2021-12-19 DIAGNOSIS — F329 Major depressive disorder, single episode, unspecified: Secondary | ICD-10-CM | POA: Diagnosis not present

## 2021-12-19 DIAGNOSIS — F411 Generalized anxiety disorder: Secondary | ICD-10-CM | POA: Diagnosis not present

## 2021-12-23 DIAGNOSIS — F411 Generalized anxiety disorder: Secondary | ICD-10-CM | POA: Diagnosis not present

## 2021-12-23 DIAGNOSIS — F329 Major depressive disorder, single episode, unspecified: Secondary | ICD-10-CM | POA: Diagnosis not present

## 2021-12-24 DIAGNOSIS — N898 Other specified noninflammatory disorders of vagina: Secondary | ICD-10-CM | POA: Diagnosis not present

## 2021-12-24 DIAGNOSIS — Z113 Encounter for screening for infections with a predominantly sexual mode of transmission: Secondary | ICD-10-CM | POA: Diagnosis not present

## 2021-12-24 DIAGNOSIS — N942 Vaginismus: Secondary | ICD-10-CM | POA: Diagnosis not present

## 2021-12-24 DIAGNOSIS — K089 Disorder of teeth and supporting structures, unspecified: Secondary | ICD-10-CM | POA: Insufficient documentation

## 2022-01-04 DIAGNOSIS — F411 Generalized anxiety disorder: Secondary | ICD-10-CM | POA: Diagnosis not present

## 2022-01-04 DIAGNOSIS — F329 Major depressive disorder, single episode, unspecified: Secondary | ICD-10-CM | POA: Diagnosis not present

## 2022-01-09 ENCOUNTER — Other Ambulatory Visit (HOSPITAL_COMMUNITY): Payer: Self-pay

## 2022-01-11 DIAGNOSIS — F411 Generalized anxiety disorder: Secondary | ICD-10-CM | POA: Diagnosis not present

## 2022-01-11 DIAGNOSIS — F329 Major depressive disorder, single episode, unspecified: Secondary | ICD-10-CM | POA: Diagnosis not present

## 2022-01-24 DIAGNOSIS — F329 Major depressive disorder, single episode, unspecified: Secondary | ICD-10-CM | POA: Diagnosis not present

## 2022-01-24 DIAGNOSIS — F411 Generalized anxiety disorder: Secondary | ICD-10-CM | POA: Diagnosis not present

## 2022-01-28 ENCOUNTER — Other Ambulatory Visit (HOSPITAL_COMMUNITY): Payer: Self-pay

## 2022-01-28 MED ORDER — CETIRIZINE HCL 10 MG PO TABS
10.0000 mg | ORAL_TABLET | Freq: Every day | ORAL | 3 refills | Status: DC
  Filled 2022-01-28 – 2022-02-05 (×2): qty 30, 30d supply, fill #0

## 2022-01-28 MED ORDER — TRIAMCINOLONE ACETONIDE 0.5 % EX CREA
1.0000 | TOPICAL_CREAM | Freq: Two times a day (BID) | CUTANEOUS | 0 refills | Status: DC
  Filled 2022-01-28: qty 30, 30d supply, fill #0
  Filled 2022-02-06: qty 30, 15d supply, fill #0

## 2022-01-29 ENCOUNTER — Encounter (HOSPITAL_COMMUNITY): Payer: Self-pay

## 2022-01-29 ENCOUNTER — Other Ambulatory Visit (HOSPITAL_COMMUNITY): Payer: Self-pay

## 2022-02-02 DIAGNOSIS — F411 Generalized anxiety disorder: Secondary | ICD-10-CM | POA: Diagnosis not present

## 2022-02-02 DIAGNOSIS — F329 Major depressive disorder, single episode, unspecified: Secondary | ICD-10-CM | POA: Diagnosis not present

## 2022-02-05 ENCOUNTER — Other Ambulatory Visit (HOSPITAL_COMMUNITY): Payer: Self-pay

## 2022-02-06 ENCOUNTER — Other Ambulatory Visit (HOSPITAL_COMMUNITY): Payer: Self-pay

## 2022-02-06 ENCOUNTER — Other Ambulatory Visit: Payer: Self-pay

## 2022-02-07 DIAGNOSIS — F419 Anxiety disorder, unspecified: Secondary | ICD-10-CM | POA: Diagnosis not present

## 2022-02-07 DIAGNOSIS — F32A Depression, unspecified: Secondary | ICD-10-CM | POA: Diagnosis not present

## 2022-02-11 DIAGNOSIS — F419 Anxiety disorder, unspecified: Secondary | ICD-10-CM | POA: Diagnosis not present

## 2022-02-11 DIAGNOSIS — F32A Depression, unspecified: Secondary | ICD-10-CM | POA: Diagnosis not present

## 2022-02-22 DIAGNOSIS — F419 Anxiety disorder, unspecified: Secondary | ICD-10-CM | POA: Diagnosis not present

## 2022-02-22 DIAGNOSIS — F32A Depression, unspecified: Secondary | ICD-10-CM | POA: Diagnosis not present

## 2022-02-28 DIAGNOSIS — F419 Anxiety disorder, unspecified: Secondary | ICD-10-CM | POA: Diagnosis not present

## 2022-02-28 DIAGNOSIS — F32A Depression, unspecified: Secondary | ICD-10-CM | POA: Diagnosis not present

## 2022-03-04 DIAGNOSIS — F419 Anxiety disorder, unspecified: Secondary | ICD-10-CM | POA: Diagnosis not present

## 2022-03-04 DIAGNOSIS — F32A Depression, unspecified: Secondary | ICD-10-CM | POA: Diagnosis not present

## 2022-03-08 ENCOUNTER — Other Ambulatory Visit (HOSPITAL_COMMUNITY): Payer: Self-pay

## 2022-03-08 MED ORDER — NORELGESTROMIN-ETH ESTRADIOL 150-35 MCG/24HR TD PTWK
MEDICATED_PATCH | TRANSDERMAL | 2 refills | Status: DC
  Filled 2022-03-08: qty 3, 28d supply, fill #0
  Filled 2022-04-09: qty 3, 28d supply, fill #1
  Filled 2022-05-01 – 2022-05-09 (×2): qty 3, 28d supply, fill #2

## 2022-03-25 ENCOUNTER — Other Ambulatory Visit: Payer: Self-pay

## 2022-03-25 ENCOUNTER — Other Ambulatory Visit (HOSPITAL_COMMUNITY): Payer: Self-pay

## 2022-03-25 DIAGNOSIS — R1084 Generalized abdominal pain: Secondary | ICD-10-CM | POA: Diagnosis not present

## 2022-03-25 DIAGNOSIS — K582 Mixed irritable bowel syndrome: Secondary | ICD-10-CM | POA: Diagnosis not present

## 2022-03-25 DIAGNOSIS — Z8639 Personal history of other endocrine, nutritional and metabolic disease: Secondary | ICD-10-CM | POA: Diagnosis not present

## 2022-03-25 DIAGNOSIS — K219 Gastro-esophageal reflux disease without esophagitis: Secondary | ICD-10-CM | POA: Diagnosis not present

## 2022-03-25 DIAGNOSIS — R109 Unspecified abdominal pain: Secondary | ICD-10-CM | POA: Diagnosis not present

## 2022-03-25 MED ORDER — LANSOPRAZOLE 30 MG PO TBDD
30.0000 mg | DELAYED_RELEASE_TABLET | Freq: Every day | ORAL | 11 refills | Status: DC
  Filled 2022-03-25 – 2022-04-11 (×3): qty 30, 30d supply, fill #0

## 2022-03-25 MED ORDER — HYOSCYAMINE SULFATE 0.125 MG PO TBDP
0.1250 mg | ORAL_TABLET | Freq: Four times a day (QID) | ORAL | 30 refills | Status: DC | PRN
  Filled 2022-03-25: qty 180, 45d supply, fill #0

## 2022-03-25 MED ORDER — ONDANSETRON 4 MG PO TBDP
4.0000 mg | ORAL_TABLET | Freq: Three times a day (TID) | ORAL | 0 refills | Status: DC | PRN
  Filled 2022-03-25 (×2): qty 30, 10d supply, fill #0

## 2022-03-26 ENCOUNTER — Other Ambulatory Visit (HOSPITAL_COMMUNITY): Payer: Self-pay

## 2022-03-26 DIAGNOSIS — Z0000 Encounter for general adult medical examination without abnormal findings: Secondary | ICD-10-CM | POA: Diagnosis not present

## 2022-03-26 DIAGNOSIS — Z8639 Personal history of other endocrine, nutritional and metabolic disease: Secondary | ICD-10-CM | POA: Diagnosis not present

## 2022-03-26 DIAGNOSIS — E538 Deficiency of other specified B group vitamins: Secondary | ICD-10-CM | POA: Diagnosis not present

## 2022-03-28 DIAGNOSIS — F32A Depression, unspecified: Secondary | ICD-10-CM | POA: Diagnosis not present

## 2022-03-28 DIAGNOSIS — F419 Anxiety disorder, unspecified: Secondary | ICD-10-CM | POA: Diagnosis not present

## 2022-04-06 DIAGNOSIS — F32A Depression, unspecified: Secondary | ICD-10-CM | POA: Diagnosis not present

## 2022-04-06 DIAGNOSIS — F419 Anxiety disorder, unspecified: Secondary | ICD-10-CM | POA: Diagnosis not present

## 2022-04-09 ENCOUNTER — Other Ambulatory Visit (HOSPITAL_COMMUNITY): Payer: Self-pay

## 2022-04-10 ENCOUNTER — Other Ambulatory Visit: Payer: Self-pay

## 2022-04-11 ENCOUNTER — Other Ambulatory Visit (HOSPITAL_COMMUNITY): Payer: Self-pay

## 2022-04-13 DIAGNOSIS — F419 Anxiety disorder, unspecified: Secondary | ICD-10-CM | POA: Diagnosis not present

## 2022-04-13 DIAGNOSIS — F32A Depression, unspecified: Secondary | ICD-10-CM | POA: Diagnosis not present

## 2022-04-17 ENCOUNTER — Other Ambulatory Visit (HOSPITAL_COMMUNITY): Payer: Self-pay

## 2022-04-17 ENCOUNTER — Other Ambulatory Visit: Payer: Self-pay

## 2022-04-17 DIAGNOSIS — R0982 Postnasal drip: Secondary | ICD-10-CM | POA: Diagnosis not present

## 2022-04-17 DIAGNOSIS — J302 Other seasonal allergic rhinitis: Secondary | ICD-10-CM | POA: Diagnosis not present

## 2022-04-17 DIAGNOSIS — Z20822 Contact with and (suspected) exposure to covid-19: Secondary | ICD-10-CM | POA: Diagnosis not present

## 2022-04-17 DIAGNOSIS — J029 Acute pharyngitis, unspecified: Secondary | ICD-10-CM | POA: Diagnosis not present

## 2022-04-17 MED ORDER — PROMETHAZINE-DM 6.25-15 MG/5ML PO SYRP
5.0000 mL | ORAL_SOLUTION | Freq: Four times a day (QID) | ORAL | 0 refills | Status: DC | PRN
  Filled 2022-04-17: qty 120, 6d supply, fill #0

## 2022-04-17 MED ORDER — IPRATROPIUM BROMIDE 0.03 % NA SOLN
NASAL | 0 refills | Status: DC
  Filled 2022-04-17: qty 30, 28d supply, fill #0
  Filled 2022-05-01 – 2022-06-24 (×3): qty 30, 30d supply, fill #0

## 2022-04-17 MED ORDER — CETIRIZINE HCL 10 MG PO TABS
10.0000 mg | ORAL_TABLET | Freq: Every day | ORAL | 0 refills | Status: DC
  Filled 2022-04-17 – 2022-05-01 (×2): qty 30, 30d supply, fill #0

## 2022-04-18 ENCOUNTER — Other Ambulatory Visit: Payer: Self-pay

## 2022-04-18 ENCOUNTER — Encounter (HOSPITAL_COMMUNITY): Payer: Self-pay

## 2022-04-18 ENCOUNTER — Other Ambulatory Visit (HOSPITAL_COMMUNITY): Payer: Self-pay

## 2022-04-23 ENCOUNTER — Other Ambulatory Visit: Payer: Self-pay

## 2022-04-23 ENCOUNTER — Other Ambulatory Visit (HOSPITAL_COMMUNITY): Payer: Self-pay

## 2022-04-23 DIAGNOSIS — Z681 Body mass index (BMI) 19 or less, adult: Secondary | ICD-10-CM | POA: Diagnosis not present

## 2022-04-23 DIAGNOSIS — Z01419 Encounter for gynecological examination (general) (routine) without abnormal findings: Secondary | ICD-10-CM | POA: Diagnosis not present

## 2022-04-23 DIAGNOSIS — Z309 Encounter for contraceptive management, unspecified: Secondary | ICD-10-CM | POA: Diagnosis not present

## 2022-04-23 DIAGNOSIS — Z113 Encounter for screening for infections with a predominantly sexual mode of transmission: Secondary | ICD-10-CM | POA: Diagnosis not present

## 2022-04-23 LAB — HM PAP SMEAR: HM Pap smear: NEGATIVE

## 2022-04-23 MED ORDER — NORELGESTROMIN-ETH ESTRADIOL 150-35 MCG/24HR TD PTWK
1.0000 | MEDICATED_PATCH | TRANSDERMAL | 3 refills | Status: DC
  Filled 2022-04-23: qty 9, 63d supply, fill #0

## 2022-04-24 DIAGNOSIS — F32A Depression, unspecified: Secondary | ICD-10-CM | POA: Diagnosis not present

## 2022-04-24 DIAGNOSIS — F419 Anxiety disorder, unspecified: Secondary | ICD-10-CM | POA: Diagnosis not present

## 2022-04-29 DIAGNOSIS — F419 Anxiety disorder, unspecified: Secondary | ICD-10-CM | POA: Diagnosis not present

## 2022-04-29 DIAGNOSIS — F32A Depression, unspecified: Secondary | ICD-10-CM | POA: Diagnosis not present

## 2022-05-01 ENCOUNTER — Other Ambulatory Visit (HOSPITAL_COMMUNITY): Payer: Self-pay

## 2022-05-02 ENCOUNTER — Other Ambulatory Visit (HOSPITAL_COMMUNITY): Payer: Self-pay

## 2022-05-02 ENCOUNTER — Encounter: Payer: Self-pay | Admitting: Nurse Practitioner

## 2022-05-02 ENCOUNTER — Ambulatory Visit: Payer: Commercial Managed Care - PPO | Admitting: Nurse Practitioner

## 2022-05-02 ENCOUNTER — Other Ambulatory Visit: Payer: Self-pay

## 2022-05-02 VITALS — BP 112/70 | HR 96 | Ht 64.0 in | Wt 113.0 lb

## 2022-05-02 DIAGNOSIS — E739 Lactose intolerance, unspecified: Secondary | ICD-10-CM | POA: Insufficient documentation

## 2022-05-02 DIAGNOSIS — E7801 Familial hypercholesterolemia: Secondary | ICD-10-CM | POA: Insufficient documentation

## 2022-05-02 DIAGNOSIS — K582 Mixed irritable bowel syndrome: Secondary | ICD-10-CM | POA: Diagnosis not present

## 2022-05-02 DIAGNOSIS — K219 Gastro-esophageal reflux disease without esophagitis: Secondary | ICD-10-CM | POA: Diagnosis not present

## 2022-05-02 MED ORDER — RED YEAST RICE EXTRACT 600 MG PO CAPS
600.0000 mg | ORAL_CAPSULE | Freq: Every day | ORAL | 3 refills | Status: DC
  Filled 2022-05-02: qty 90, 90d supply, fill #0
  Filled 2023-01-14: qty 100, 100d supply, fill #0

## 2022-05-02 NOTE — Progress Notes (Signed)
Orma Render, DNP, AGNP-c [facility] [address] [place], [place] [address] [place] [phone]   New patient visit   Patient: Rebecca Griffin   DOB: [birth year]   22 y.o. Female  MRN: [id number] Visit Date: 05/02/2022  Patient Care Team: [facility] as PCP - General  Today's Vitals   05/02/22 1044  BP: 112/70  Pulse: 96  Weight: 113 lb (51.3 kg)  Height: '5\' 4"'$  (1.626 m)   Body mass index is 19.4 kg/m.   Today's healthcare provider: Orma Render, NP   Chief Complaint  Patient presents with   other    New pt. Est. And discuss lab from other provider, the cholesterol was high, eats a lot of junk food and in the middle of the night, GI referral for IBS and GERD,    Subjective    Rebecca Griffin is a 22 y.o. female who presents today as a new patient to establish care.    Rebecca Griffin presents today to establish care. She discusses with concerns regarding her recent increase in cholesterol levels, despite making dietary changes. She reports incorporating healthier food options into her diet, such as vegan cheese and opting for [place] over ground beef. She has a notable family history of diabetes on both maternal and paternal sides.  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 She is interested in understanding if there is a possibility of discontinuing treatment should her cholesterol levels improve.  In addition to her concerns about cholesterol, the patient has a history of constipation for which she has tried various treatments, including MiraLAX and Golytely. Despite experiencing fluctuations between diarrhea and constipation, she ensures adequate water intake.  Recently, the patient experienced a leg cramp, which she believes may be due to dehydration. She has been proactive in increasing her water consumption to address this  issue.  Furthermore, the patient has a history of acid reflux and is currently managing the condition with probiotics. She also mentions that she is taking birth control. She is open to discussing her treatment options and seeks advice on managing her cholesterol levels and other health concerns.  History reviewed and reveals the following: Past Medical History:  Diagnosis Date   Asthma    Constipation    History reviewed. No pertinent surgical history. Family Status  Relation Name Status   Mother  Alive   Father  Alive   Family History  Problem Relation Age of Onset   Hypertension Mother    Social History   Socioeconomic History   Marital status: Single    Spouse name: Not on file   Number of children: Not on file   Years of education: Not on file   Highest education level: Not on file  Occupational History   Not on file  Tobacco Use   Smoking status: Never   Smokeless tobacco: Never  Vaping Use   Vaping Use: Never used  Substance and Sexual Activity   Alcohol use: Never   Drug use: Never   Sexual activity: Not on file  Other Topics Concern   Not on file  Social History Narrative   Not on file   Social Determinants of Health   Financial Resource Strain: Not on file  Food Insecurity: Not on file  Transportation Needs: Not on file  Physical Activity: Not on file  Stress: Not on file  Social Connections: Not on  file   Outpatient Medications Prior to Visit  Medication Sig   hyoscyamine (ANASPAZ) 0.125 MG TBDP disintergrating tablet Place 1 tablet (0.125 mg total) under the tongue every 6 (six) hours as needed for abdominal cramping   lansoprazole (PREVACID SOLUTAB) 30 MG disintegrating tablet Dissolve 1 tablet (30 mg total) by mouth daily.   ondansetron (ZOFRAN-ODT) 4 MG disintegrating tablet Dissolve 1 tablet (4 mg total) by mouth every 8 (eight) hours as needed for nausea   [DISCONTINUED] cetirizine (ZYRTEC ALLERGY) 10 MG tablet Take 1 tablet by mouth once  daily.   [DISCONTINUED] cetirizine (ZYRTEC) 10 MG tablet Take 1 tablet (10 mg total) by mouth daily.   [DISCONTINUED] cetirizine (ZYRTEC) 10 MG tablet Take 1 tablet (10 mg total) by mouth once daily   [DISCONTINUED] fluticasone (FLONASE) 50 MCG/ACT nasal spray Place 1 spray into both nostrils 2 (two) times daily.   [DISCONTINUED] lansoprazole (PREVACID) 30 MG capsule Take 1 capsule by mouth daily.   [DISCONTINUED] norelgestromin-ethinyl estradiol (ZAFEMY) 150-35 MCG/24HR transdermal patch Place 1 patch onto the skin once a week.   [DISCONTINUED] triamcinolone cream (KENALOG) 0.5 % Apply 1 Application topically 2 (two) times daily.   esomeprazole (NEXIUM) 40 MG capsule Take 1 capsule by mouth 15-20 minutes before breakfast (Patient not taking: Reported on 05/02/2022)   ipratropium (ATROVENT) 0.03 % nasal spray Place 2 sprays into both nostrils 2 (two)- 3 (three) times daily as directed (Patient not taking: Reported on 05/02/2022)   norelgestromin-ethinyl estradiol Marilu Favre) 150-35 MCG/24HR transdermal patch APPLY 1 PATCH ONCE A WEEK (Patient not taking: Reported on 05/02/2022)   polyethylene glycol (MIRALAX MIX-IN PAX) 17 g packet Take 17 g by mouth daily. (Patient not taking: Reported on 05/02/2022)   triamcinolone cream (KENALOG) 0.5 % Apply to affected area 2 times a day (Patient not taking: Reported on 05/02/2022)   [DISCONTINUED] azithromycin (ZITHROMAX) 200 MG/5ML suspension take 12.5 mL by mouth today, then decrease to 6.25 mL by mouth daily for 4 days (Patient not taking: Reported on 05/02/2022)   [DISCONTINUED] docusate sodium (COLACE) 100 MG capsule Take 1 capsule (100 mg total) by mouth every 12 (twelve) hours. (Patient not taking: Reported on 05/02/2022)   [DISCONTINUED] ferrous sulfate 325 (65 FE) MG tablet Take by mouth.   [DISCONTINUED] metroNIDAZOLE (METROGEL) 0.75 % vaginal gel Insert 1 applicatorful vaginally 2 (two) times daily for 5 days (no alcohol) (Patient not taking: Reported on 05/02/2022)    [DISCONTINUED] norelgestromin-ethinyl estradiol Marilu Favre) 150-35 MCG/24HR transdermal patch Place 1 patch onto the skin every 7 (seven) days (Patient not taking: Reported on 05/02/2022)   [DISCONTINUED] pantoprazole (PROTONIX) 40 MG tablet Take 1 tablet by mouth daily. (Patient not taking: Reported on 05/02/2022)   [DISCONTINUED] prednisoLONE (ORAPRED) 15 MG/5ML solution Take 5 mLs (15 mg total) by mouth 2 (two) times daily for 5 days. take with food (Patient not taking: Reported on 05/02/2022)   [DISCONTINUED] promethazine-dextromethorphan (PROMETHAZINE-DM) 6.25-15 MG/5ML syrup Take 5 mLs by mouth every 6 (six) hours as needed for Cough for up to 7 days (Patient not taking: Reported on 05/02/2022)   [DISCONTINUED] promethazine-dextromethorphan (PROMETHAZINE-DM) 6.25-15 MG/5ML syrup Take 5 mLs by mouth every 6 (six) hours as needed for cough. (Patient not taking: Reported on 05/02/2022)   No facility-administered medications prior to visit.   Allergies  Allergen Reactions   Fd&C Red #40 [Red Dye]    Lactose Other (See Comments)   Lactose Intolerance (Gi)    Immunization History  Administered Date(s) Administered   Dtap, Unspecified 11/23/1999, 01/04/2000, 03/21/2000,  03/03/2001, 09/26/2003   HIB, Unspecified 11/23/1999, 01/04/2000, 03/21/2000, 03/13/2001   Hep A, Unspecified 10/12/2010   Hep B, Unspecified Oct 25, 1999, 10/04/1999, 05/26/2000   Hepatitis A, Ped/Adol-2 Dose 11/06/2007, 10/12/2010   Influenza Nasal 01/09/2010   Influenza Split 11/06/2007   Influenza-Unspecified 12/28/2002, 02/09/2003, 11/29/2021   MMR 03/13/2001, 09/26/2003   Meningococcal Conjugate 09/25/2011, 07/25/2017   Meningococcal Mcv4,unspecified 09/25/2011   PFIZER Comirnaty(Gray Top)Covid-19 Tri-Sucrose Vaccine 04/08/2020   Pneumococcal Conjugate PCV 7 03/21/2000, 11/20/2001   Polio, Unspecified 11/23/1999, 01/04/2000, 09/26/2000, 09/26/2003   Tdap 10/12/2010, 03/28/2021   Varicella 09/26/2000, 09/25/2011    Health  Maintenance Due Health Maintenance Topics with due status: Overdue     Topic Date Due   PAP-Cervical Cytology Screening Never done   PAP SMEAR-Modifier Never done    Review of Systems All review of systems negative except what is listed in the HPI   Objective    BP 112/70   Pulse 96   Ht '5\' 4"'$  (1.626 m)   Wt 113 lb (51.3 kg)   LMP 04/10/2022   BMI 19.40 kg/m  Physical Exam Vitals and nursing note reviewed.  Constitutional:      General: She is not in acute distress.    Appearance: Normal appearance.  HENT:     Head: Normocephalic.     Right Ear: Hearing normal.     Left Ear: Hearing normal.     Mouth/Throat:     Lips: Pink.  Eyes:     General: Lids are normal. Vision grossly intact.     Extraocular Movements: Extraocular movements intact.     Conjunctiva/sclera: Conjunctivae normal.     Funduscopic exam:    Right eye: No hemorrhage. Red reflex present.        Left eye: No hemorrhage. Red reflex present.    Visual Fields: Right eye visual fields normal and left eye visual fields normal.  Neck:     Thyroid: No thyromegaly.     Vascular: No carotid bruit or JVD.  Cardiovascular:     Rate and Rhythm: Normal rate and regular rhythm.     Chest Wall: PMI is not displaced.     Pulses: Normal pulses.     Heart sounds: Normal heart sounds.  Pulmonary:     Effort: Pulmonary effort is normal.     Breath sounds: Normal breath sounds.  Abdominal:     General: Abdomen is flat. Bowel sounds are normal.     Palpations: Abdomen is soft.  Musculoskeletal:        General: Normal range of motion.     Cervical back: Normal range of motion. No tenderness.     Right lower leg: No edema.     Left lower leg: No edema.  Lymphadenopathy:     Cervical: No cervical adenopathy.  Skin:    General: Skin is warm and dry.     Capillary Refill: Capillary refill takes less than 2 seconds.  Neurological:     General: No focal deficit present.     Mental Status: She is alert and oriented to  person, place, and time.     Motor: No weakness.     Gait: Gait normal.  Psychiatric:        Mood and Affect: Mood normal.        Behavior: Behavior normal.        Thought Content: Thought content normal.        Judgment: Judgment normal.     No results found for any visits  on 05/02/22.  Assessment & Plan      Problem List Items Addressed This Visit     Familial hypercholesterolemia - Primary    The patient presents with elevated cholesterol levels despite dietary modifications. HDL levels are satisfactory at 85, but LDL remains high. Familial hyperlipidemia is suspected due to a family history of diabetes and high cholesterol. The patient maintains an active lifestyle and has a weight within normal limits. Given the strong family history, I do feel a low dose statin would be the best option to help manage her LDL and take a proactive approach to reducing her risk of CVD. She would like to try a more natural option first.  Plan: - Begin treatment with red yeast rice supplements as a non-statin option to manage cholesterol levels. - Educate the patient on the potential necessity for long-term therapy, particularly if familial hyperlipidemia is confirmed. - Schedule a follow-up in six months to evaluate the effectiveness of the red yeast rice supplement. - Consider initiating low-dose statin therapy if the red yeast rice supplement proves ineffective after a six-month period. - Provide guidance on the over-the-counter availability and proper administration of red yeast rice, including the option to sprinkle capsule contents on food.      Relevant Medications   Red Yeast Rice Extract 600 MG CAPS   Gastroesophageal reflux disease   Relevant Orders   Ambulatory referral to Gastroenterology   Irritable bowel syndrome with both constipation and diarrhea    The patient endorses symptoms of alternating diarrhea and constipation with increased need for stimulant laxative use to process a bowel  movement.  Plan: - Increase water and fiber. - Consider GI referral for further evaluation- orders placed.         Return in about 6 months (around 11/02/2022) for Med Management.      Lisaanne Lawrie, Coralee Pesa, NP, DNP, AGNP-C [facility]

## 2022-05-02 NOTE — Patient Instructions (Signed)
Start the [employer] supplement daily. We will see what your cholesterol levels look like in about 6 months.

## 2022-05-02 NOTE — Assessment & Plan Note (Signed)
The patient endorses symptoms of alternating diarrhea and constipation with increased need for stimulant laxative use to process a bowel movement.  Plan: - Increase water and fiber. - Consider GI referral for further evaluation- orders placed.

## 2022-05-02 NOTE — Assessment & Plan Note (Signed)
The patient presents with elevated cholesterol levels despite dietary modifications. HDL levels are satisfactory at 85, but LDL remains high. Familial hyperlipidemia is suspected due to a family history of diabetes and high cholesterol. The patient maintains an active lifestyle and has a weight within normal limits. Given the strong family history, I do feel a low dose statin would be the best option to help manage her LDL and take a proactive approach to reducing her risk of CVD. She would like to try a more natural option first.  Plan: - Begin treatment with red yeast rice supplements as a non-statin option to manage cholesterol levels. - Educate the patient on the potential necessity for long-term therapy, particularly if familial hyperlipidemia is confirmed. - Schedule a follow-up in six months to evaluate the effectiveness of the red yeast rice supplement. - Consider initiating low-dose statin therapy if the red yeast rice supplement proves ineffective after a six-month period. - Provide guidance on the over-the-counter availability and proper administration of red yeast rice, including the option to sprinkle capsule contents on food.

## 2022-05-06 DIAGNOSIS — F32A Depression, unspecified: Secondary | ICD-10-CM | POA: Diagnosis not present

## 2022-05-06 DIAGNOSIS — F419 Anxiety disorder, unspecified: Secondary | ICD-10-CM | POA: Diagnosis not present

## 2022-05-09 ENCOUNTER — Other Ambulatory Visit (HOSPITAL_COMMUNITY): Payer: Self-pay

## 2022-05-09 ENCOUNTER — Encounter (HOSPITAL_COMMUNITY): Payer: Self-pay

## 2022-05-09 ENCOUNTER — Other Ambulatory Visit: Payer: Self-pay | Admitting: Nurse Practitioner

## 2022-05-09 MED ORDER — CETIRIZINE HCL 10 MG PO TABS
10.0000 mg | ORAL_TABLET | Freq: Every day | ORAL | 1 refills | Status: DC
  Filled 2022-05-09 – 2022-06-24 (×2): qty 30, 30d supply, fill #0

## 2022-05-10 ENCOUNTER — Other Ambulatory Visit: Payer: Self-pay

## 2022-05-11 ENCOUNTER — Other Ambulatory Visit (HOSPITAL_COMMUNITY): Payer: Self-pay

## 2022-05-13 ENCOUNTER — Encounter (HOSPITAL_COMMUNITY): Payer: Self-pay

## 2022-05-13 ENCOUNTER — Other Ambulatory Visit (HOSPITAL_COMMUNITY): Payer: Self-pay

## 2022-05-13 ENCOUNTER — Other Ambulatory Visit: Payer: Self-pay

## 2022-05-14 ENCOUNTER — Other Ambulatory Visit (HOSPITAL_COMMUNITY): Payer: Self-pay

## 2022-05-16 ENCOUNTER — Encounter: Payer: Self-pay | Admitting: Nurse Practitioner

## 2022-05-21 DIAGNOSIS — F419 Anxiety disorder, unspecified: Secondary | ICD-10-CM | POA: Diagnosis not present

## 2022-05-21 DIAGNOSIS — F4320 Adjustment disorder, unspecified: Secondary | ICD-10-CM | POA: Diagnosis not present

## 2022-05-29 DIAGNOSIS — F4320 Adjustment disorder, unspecified: Secondary | ICD-10-CM | POA: Diagnosis not present

## 2022-05-29 DIAGNOSIS — F419 Anxiety disorder, unspecified: Secondary | ICD-10-CM | POA: Diagnosis not present

## 2022-05-30 ENCOUNTER — Other Ambulatory Visit (HOSPITAL_COMMUNITY): Payer: Self-pay

## 2022-05-30 MED ORDER — NORELGESTROMIN-ETH ESTRADIOL 150-35 MCG/24HR TD PTWK
1.0000 | MEDICATED_PATCH | TRANSDERMAL | 3 refills | Status: DC
  Filled 2022-05-30: qty 9, 63d supply, fill #0
  Filled 2022-08-19: qty 9, 63d supply, fill #1
  Filled 2022-11-07: qty 9, 63d supply, fill #2
  Filled 2023-01-14: qty 9, 63d supply, fill #3

## 2022-05-31 ENCOUNTER — Other Ambulatory Visit: Payer: Self-pay

## 2022-06-03 ENCOUNTER — Telehealth: Payer: Self-pay | Admitting: Gastroenterology

## 2022-06-03 NOTE — Telephone Encounter (Signed)
Dr. Lavon Paganini,  We received a referral for this patient for acid reflux.  Patient was seen at [facility] 03/2022 (records in [place]).  I spoke with patient and she is requesting to switch to [facility] because she recently moved to [place] [facility] and would like all of her doctors with [facility].  Will you please review and advise scheduling and transfer of care.  Thanks Christy  Dr. Lavon Paganini supervising MD 4/8/24PM

## 2022-06-03 NOTE — Telephone Encounter (Signed)
Spoke with patient and made her aware of Dr. Elana Alm recommendations.

## 2022-06-03 NOTE — Telephone Encounter (Signed)
Request received to transfer GI care from outside practice to [facility].  We appreciate the interest in our practice, however at this time due to high demand from patients without established GI providers we cannot accommodate this transfer.  Ability to accommodate future transfer requests may change over time and the patient can contact us again in 12 months if still interested in being seen at [facility].

## 2022-06-04 ENCOUNTER — Other Ambulatory Visit (HOSPITAL_COMMUNITY): Payer: Self-pay

## 2022-06-07 DIAGNOSIS — F419 Anxiety disorder, unspecified: Secondary | ICD-10-CM | POA: Diagnosis not present

## 2022-06-07 DIAGNOSIS — F4320 Adjustment disorder, unspecified: Secondary | ICD-10-CM | POA: Diagnosis not present

## 2022-06-19 DIAGNOSIS — F411 Generalized anxiety disorder: Secondary | ICD-10-CM | POA: Diagnosis not present

## 2022-06-19 DIAGNOSIS — F4320 Adjustment disorder, unspecified: Secondary | ICD-10-CM | POA: Diagnosis not present

## 2022-06-24 ENCOUNTER — Other Ambulatory Visit (HOSPITAL_COMMUNITY): Payer: Self-pay

## 2022-06-24 DIAGNOSIS — F4320 Adjustment disorder, unspecified: Secondary | ICD-10-CM | POA: Diagnosis not present

## 2022-06-24 DIAGNOSIS — F411 Generalized anxiety disorder: Secondary | ICD-10-CM | POA: Diagnosis not present

## 2022-06-27 ENCOUNTER — Encounter: Payer: Self-pay | Admitting: Nurse Practitioner

## 2022-06-27 ENCOUNTER — Other Ambulatory Visit (HOSPITAL_COMMUNITY): Payer: Self-pay

## 2022-06-27 ENCOUNTER — Other Ambulatory Visit: Payer: Self-pay

## 2022-06-27 DIAGNOSIS — K219 Gastro-esophageal reflux disease without esophagitis: Secondary | ICD-10-CM

## 2022-06-27 DIAGNOSIS — K582 Mixed irritable bowel syndrome: Secondary | ICD-10-CM

## 2022-06-28 ENCOUNTER — Telehealth: Payer: Self-pay

## 2022-06-28 NOTE — Telephone Encounter (Signed)
No

## 2022-06-28 NOTE — Telephone Encounter (Signed)
Called and left a message for call back  

## 2022-06-28 NOTE — Telephone Encounter (Signed)
We received a referral from pcp for Gastroesophageal reflux disease, unspecified whether esophagitis present Irritable bowel syndrome with both constipation and diarrhea. Patient was seen at [facility] on 03/25/2022 by Dr. Timothy Lasso. Tried to go to [facility] but they said they were not accepting transfers at this time. Please advise see patient in the office

## 2022-07-01 NOTE — Telephone Encounter (Signed)
Informed patient of this information ?

## 2022-07-01 NOTE — Telephone Encounter (Signed)
Called and left a message for call back  

## 2022-07-03 DIAGNOSIS — F4320 Adjustment disorder, unspecified: Secondary | ICD-10-CM | POA: Diagnosis not present

## 2022-07-03 DIAGNOSIS — F411 Generalized anxiety disorder: Secondary | ICD-10-CM | POA: Diagnosis not present

## 2022-07-10 DIAGNOSIS — F411 Generalized anxiety disorder: Secondary | ICD-10-CM | POA: Diagnosis not present

## 2022-07-10 DIAGNOSIS — F4320 Adjustment disorder, unspecified: Secondary | ICD-10-CM | POA: Diagnosis not present

## 2022-07-17 DIAGNOSIS — F4322 Adjustment disorder with anxiety: Secondary | ICD-10-CM | POA: Diagnosis not present

## 2022-07-17 DIAGNOSIS — F411 Generalized anxiety disorder: Secondary | ICD-10-CM | POA: Diagnosis not present

## 2022-07-18 ENCOUNTER — Other Ambulatory Visit (HOSPITAL_COMMUNITY): Payer: Self-pay

## 2022-07-24 DIAGNOSIS — F411 Generalized anxiety disorder: Secondary | ICD-10-CM | POA: Diagnosis not present

## 2022-07-24 DIAGNOSIS — F4322 Adjustment disorder with anxiety: Secondary | ICD-10-CM | POA: Diagnosis not present

## 2022-07-31 DIAGNOSIS — F4322 Adjustment disorder with anxiety: Secondary | ICD-10-CM | POA: Diagnosis not present

## 2022-07-31 DIAGNOSIS — F411 Generalized anxiety disorder: Secondary | ICD-10-CM | POA: Diagnosis not present

## 2022-08-07 DIAGNOSIS — F411 Generalized anxiety disorder: Secondary | ICD-10-CM | POA: Diagnosis not present

## 2022-08-07 DIAGNOSIS — F4322 Adjustment disorder with anxiety: Secondary | ICD-10-CM | POA: Diagnosis not present

## 2022-08-14 DIAGNOSIS — F411 Generalized anxiety disorder: Secondary | ICD-10-CM | POA: Diagnosis not present

## 2022-08-14 DIAGNOSIS — F4322 Adjustment disorder with anxiety: Secondary | ICD-10-CM | POA: Diagnosis not present

## 2022-08-19 ENCOUNTER — Other Ambulatory Visit (HOSPITAL_COMMUNITY): Payer: Self-pay

## 2022-08-21 DIAGNOSIS — F411 Generalized anxiety disorder: Secondary | ICD-10-CM | POA: Diagnosis not present

## 2022-08-21 DIAGNOSIS — F4322 Adjustment disorder with anxiety: Secondary | ICD-10-CM | POA: Diagnosis not present

## 2022-08-28 DIAGNOSIS — F4322 Adjustment disorder with anxiety: Secondary | ICD-10-CM | POA: Diagnosis not present

## 2022-08-28 DIAGNOSIS — F411 Generalized anxiety disorder: Secondary | ICD-10-CM | POA: Diagnosis not present

## 2022-09-04 DIAGNOSIS — F4322 Adjustment disorder with anxiety: Secondary | ICD-10-CM | POA: Diagnosis not present

## 2022-09-04 DIAGNOSIS — F411 Generalized anxiety disorder: Secondary | ICD-10-CM | POA: Diagnosis not present

## 2022-09-09 IMAGING — DX DG ABDOMEN 1V
1 series · 1 of 1 positions shown · non-contrast
Comparison: None.

CLINICAL DATA: Abdominal pain. Nausea and diarrhea for 1 day.
History of constipation.

EXAM:
ABDOMEN - 1 VIEW

[abdomen kub]
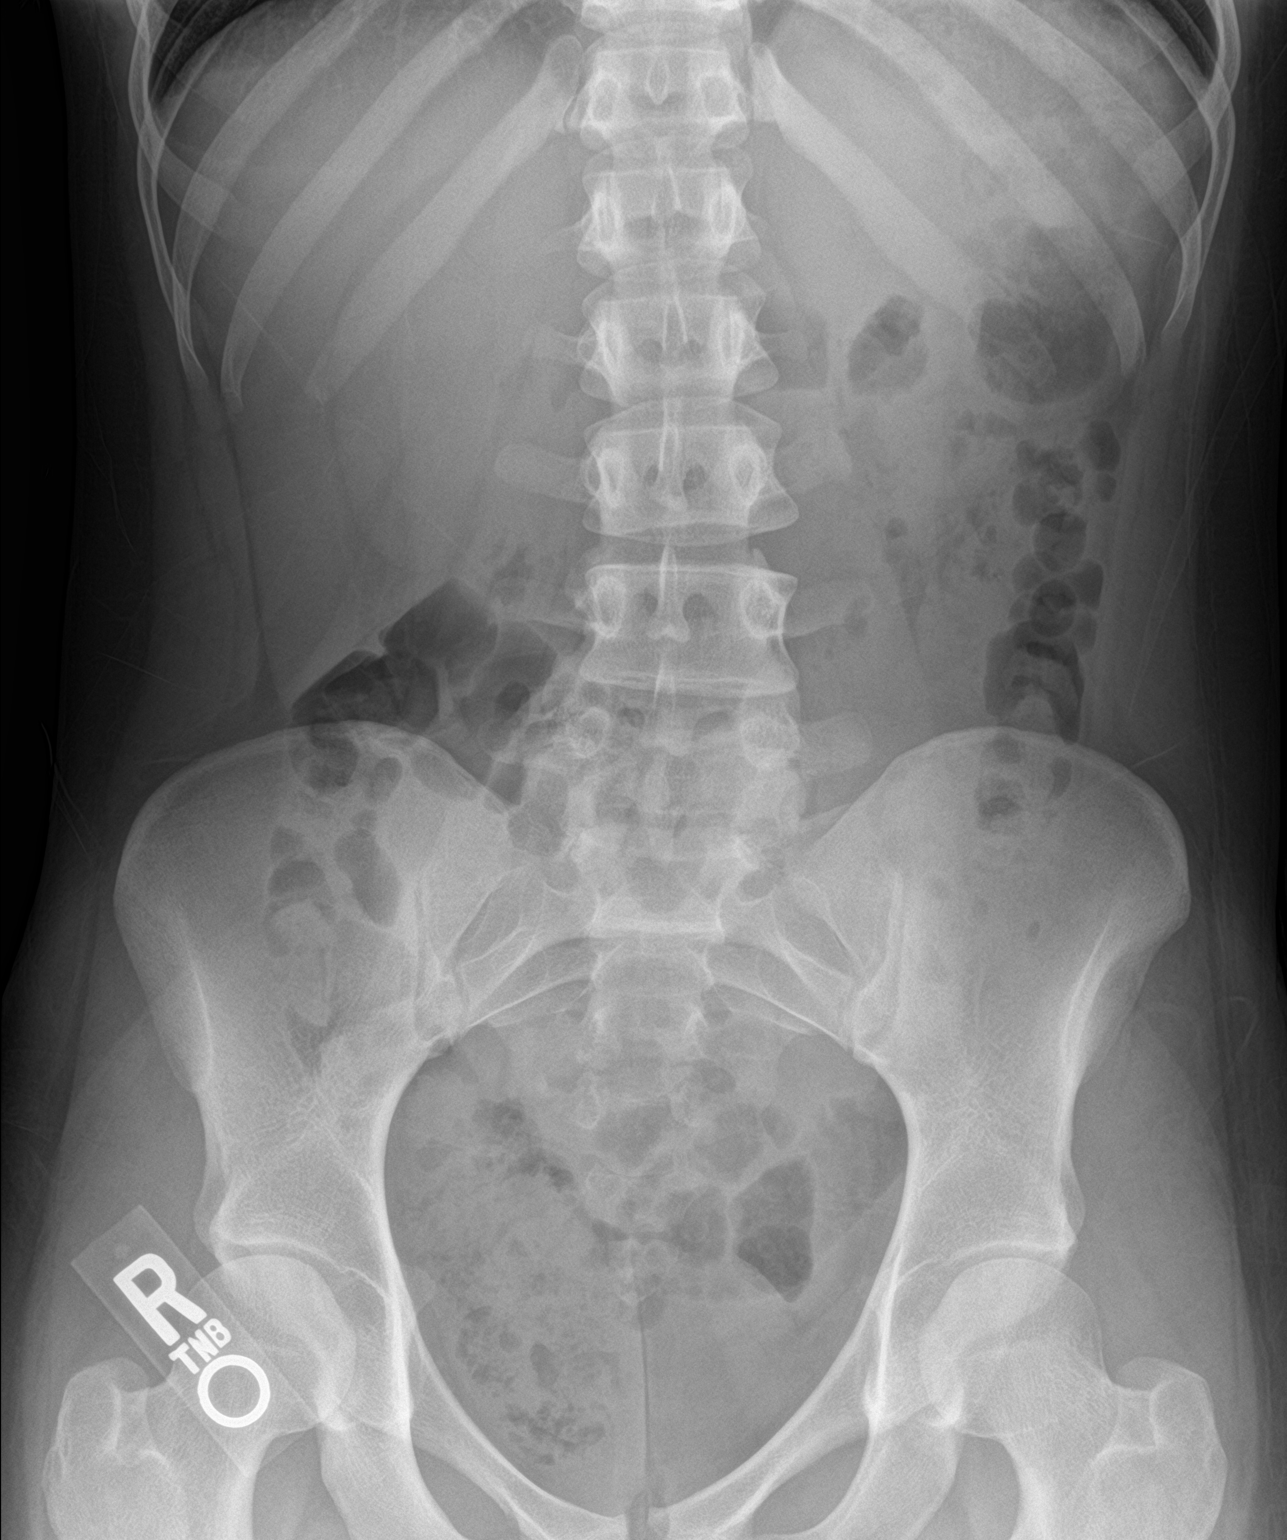

[1 of 1 positions shown; findings below may reference images not displayed]

FINDINGS: Normal bowel gas pattern.  Mild colonic stool burden.

Normal abdominopelvic soft tissues. Skeletal structures are
unremarkable.
IMPRESSION: Negative.

## 2022-09-10 DIAGNOSIS — F411 Generalized anxiety disorder: Secondary | ICD-10-CM | POA: Diagnosis not present

## 2022-09-10 DIAGNOSIS — F4322 Adjustment disorder with anxiety: Secondary | ICD-10-CM | POA: Diagnosis not present

## 2022-09-11 ENCOUNTER — Ambulatory Visit: Payer: Commercial Managed Care - PPO | Admitting: Family Medicine

## 2022-09-11 ENCOUNTER — Encounter: Payer: Self-pay | Admitting: Family Medicine

## 2022-09-11 VITALS — BP 108/68 | HR 89 | Temp 98.1°F | Resp 16 | Wt 114.6 lb

## 2022-09-11 DIAGNOSIS — K582 Mixed irritable bowel syndrome: Secondary | ICD-10-CM

## 2022-09-11 DIAGNOSIS — K2100 Gastro-esophageal reflux disease with esophagitis, without bleeding: Secondary | ICD-10-CM

## 2022-09-11 DIAGNOSIS — H9201 Otalgia, right ear: Secondary | ICD-10-CM

## 2022-09-11 DIAGNOSIS — E739 Lactose intolerance, unspecified: Secondary | ICD-10-CM | POA: Diagnosis not present

## 2022-09-11 NOTE — Progress Notes (Signed)
   Subjective:    Patient ID: Rebecca Griffin, female    DOB: 2000-12-06, 22 y.o.   MRN: [id number]  HPI She is here for consult concerning continued difficulty with reflux and possible irritable bowel syndrome.  She was referred to [facility] however they could not accept any new patients due to their patient load.  Presently she is taking Prevacid and using hyoscyamine and ondansetron on a as needed basis.  She does have underlying lactose intolerance and does avoid this is much as possible.  She also notes that chocolate makes her reflux symptoms worse.   Review of Systems     Objective:    Physical Exam Alert and in no distress. Tympanic membranes and canals are normal. Pharyngeal area is normal. Neck is supple without adenopathy or thyromegaly. Cardiac exam shows a regular sinus rhythm without murmurs or gallops. Lungs are clear to auscultation.        Assessment & Plan:   Problem List Items Addressed This Visit     Gastroesophageal reflux disease   Irritable bowel syndrome with both constipation and diarrhea   Lactose intolerance - Primary   Other Visit Diagnoses     Earache on right         Conservative care for the earache with Tylenol for pain. I will refer to Dr. Elnoria Howard to further evaluate her GI symptoms to get a better handle on reflux versus IBS and potential other therapies for this.

## 2022-09-18 DIAGNOSIS — F411 Generalized anxiety disorder: Secondary | ICD-10-CM | POA: Diagnosis not present

## 2022-09-18 DIAGNOSIS — F4322 Adjustment disorder with anxiety: Secondary | ICD-10-CM | POA: Diagnosis not present

## 2022-09-24 DIAGNOSIS — F4322 Adjustment disorder with anxiety: Secondary | ICD-10-CM | POA: Diagnosis not present

## 2022-09-24 DIAGNOSIS — F411 Generalized anxiety disorder: Secondary | ICD-10-CM | POA: Diagnosis not present

## 2022-09-30 DIAGNOSIS — F4322 Adjustment disorder with anxiety: Secondary | ICD-10-CM | POA: Diagnosis not present

## 2022-09-30 DIAGNOSIS — F411 Generalized anxiety disorder: Secondary | ICD-10-CM | POA: Diagnosis not present

## 2022-10-09 DIAGNOSIS — F411 Generalized anxiety disorder: Secondary | ICD-10-CM | POA: Diagnosis not present

## 2022-10-09 DIAGNOSIS — F4322 Adjustment disorder with anxiety: Secondary | ICD-10-CM | POA: Diagnosis not present

## 2022-10-14 DIAGNOSIS — F4322 Adjustment disorder with anxiety: Secondary | ICD-10-CM | POA: Diagnosis not present

## 2022-10-14 DIAGNOSIS — F411 Generalized anxiety disorder: Secondary | ICD-10-CM | POA: Diagnosis not present

## 2022-10-23 DIAGNOSIS — F4322 Adjustment disorder with anxiety: Secondary | ICD-10-CM | POA: Diagnosis not present

## 2022-10-23 DIAGNOSIS — F411 Generalized anxiety disorder: Secondary | ICD-10-CM | POA: Diagnosis not present

## 2022-10-27 IMAGING — CT CT ABD-PELV W/ CM
1 of 2 series · 15 of 32 positions shown, 19 images · IV contrast (omnipaque)
Comparison: None.

CLINICAL DATA: Acute nonlocalized abdominal pain. Pain and
constipation for months.

EXAM:
CT ABDOMEN AND PELVIS WITH CONTRAST
TECHNIQUE: Multidetector CT imaging of the abdomen and pelvis was performed
using the standard protocol following bolus administration of
intravenous contrast.
CONTRAST:  80mL OMNIPAQUE IOHEXOL 300 MG/ML  SOLN

[Series 2: abd pel w · axial · 0.56mm/px · z∈[+593,+963]mm · 15 of 82 slices shown, 19 images]
[im 4/82  soft-tissue]
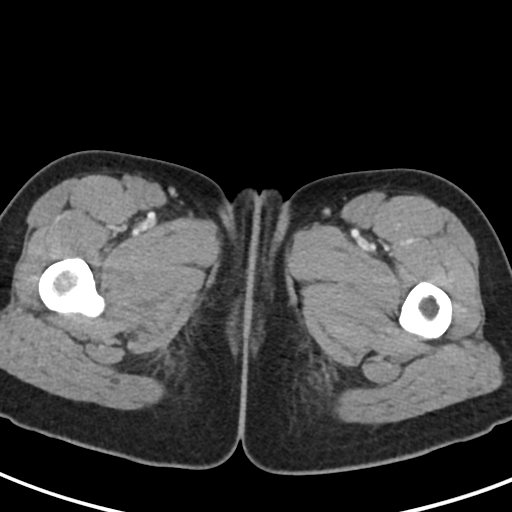
[im 4/82  bone]
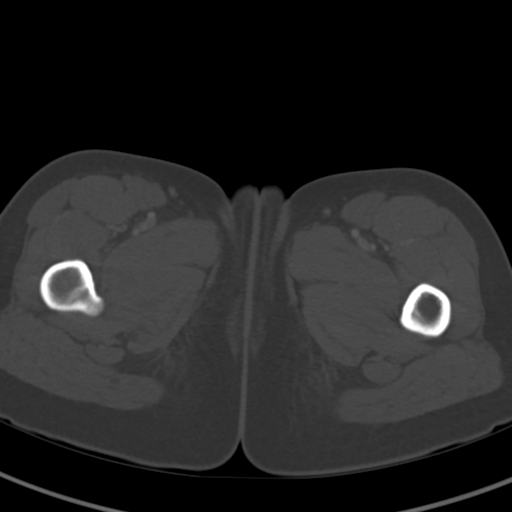
[im 10/82  soft-tissue]
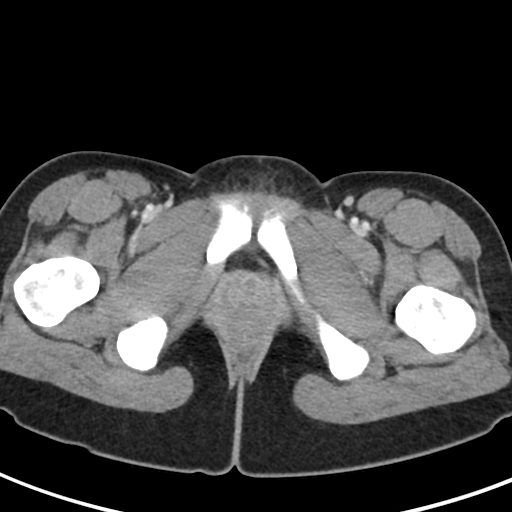
[im 16/82  soft-tissue]
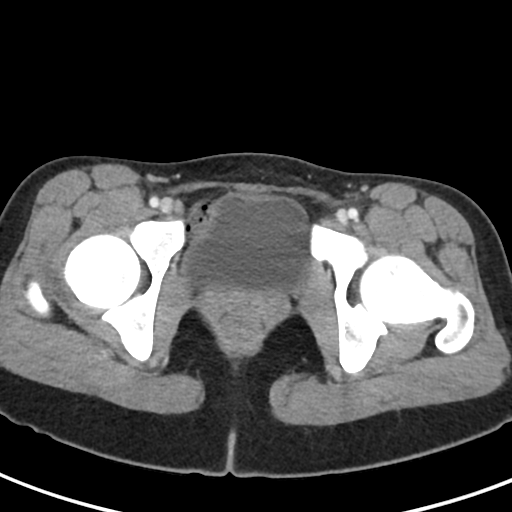
[im 22/82  soft-tissue]
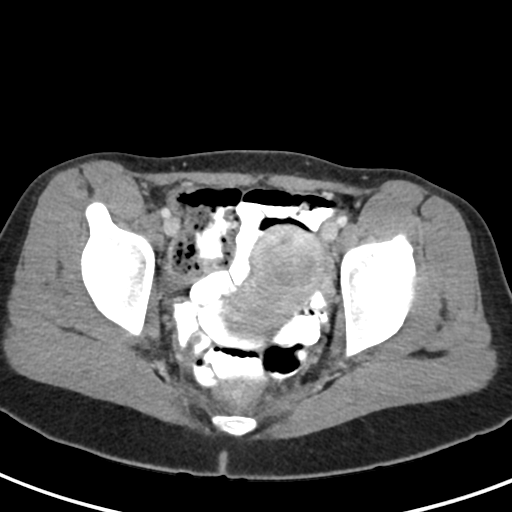
[im 29/82  soft-tissue]
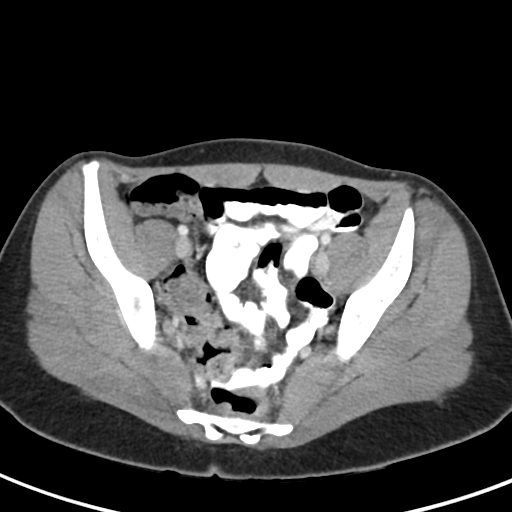
[im 35/82  soft-tissue]
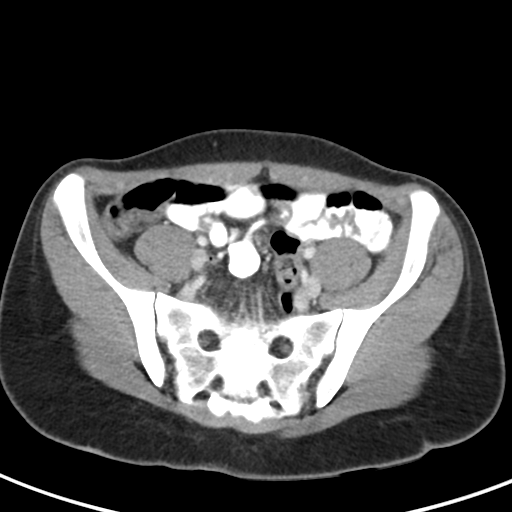
[im 41/82  soft-tissue]
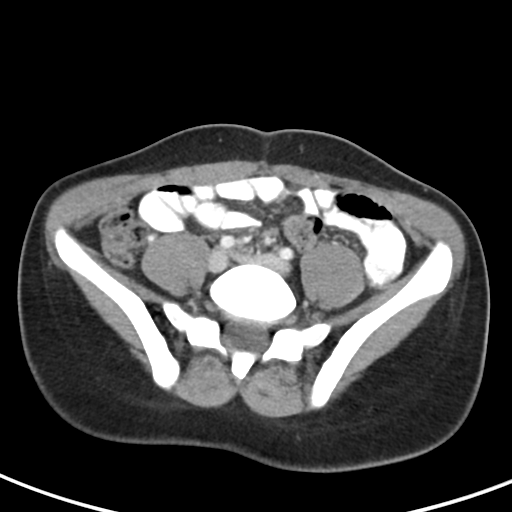
[im 47/82  soft-tissue]
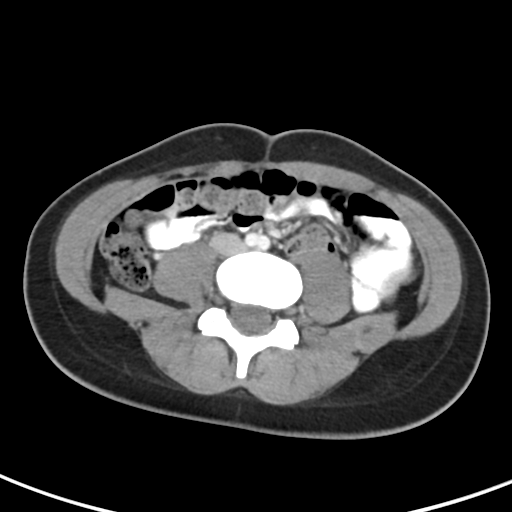
[im 53/82  soft-tissue]
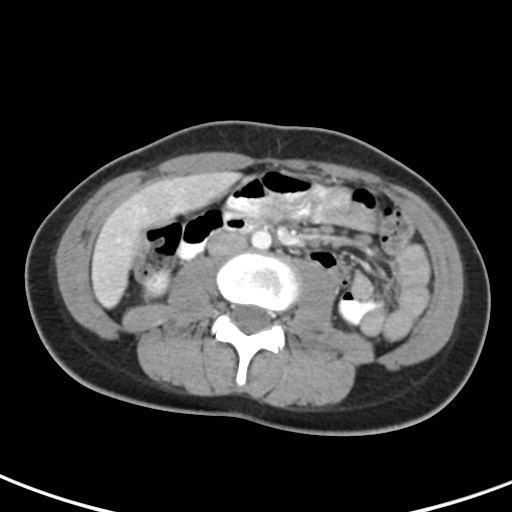
[im 53/82  bone]
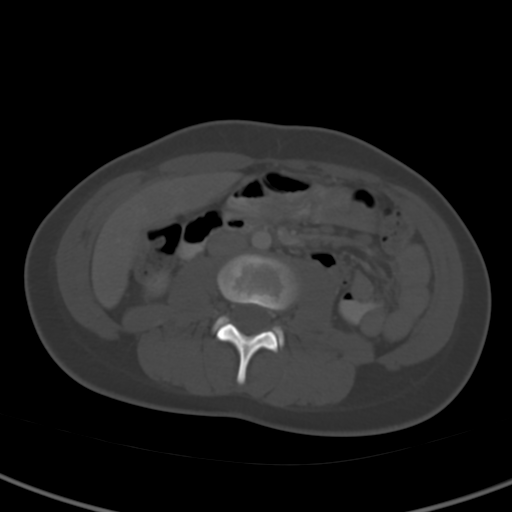
[im 60/82  soft-tissue]
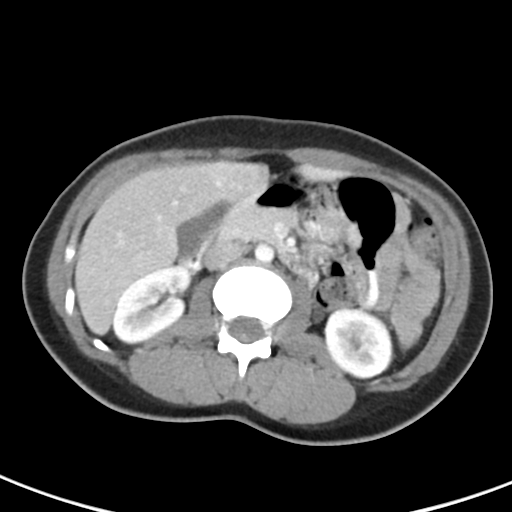
[im 66/82  soft-tissue]
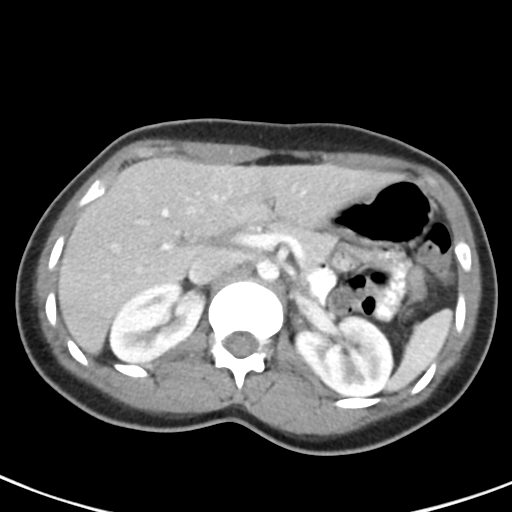
[im 69/82  lung]
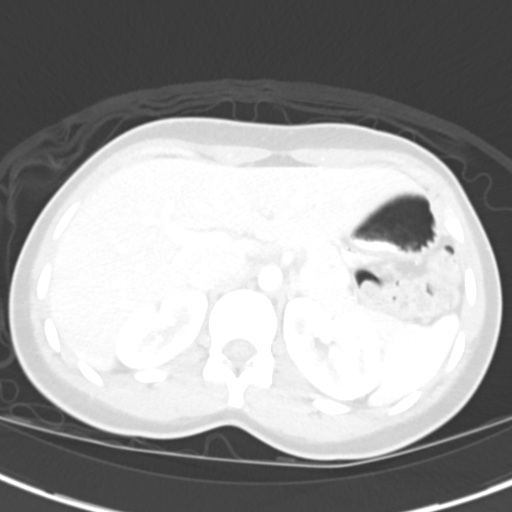
[im 72/82  soft-tissue]
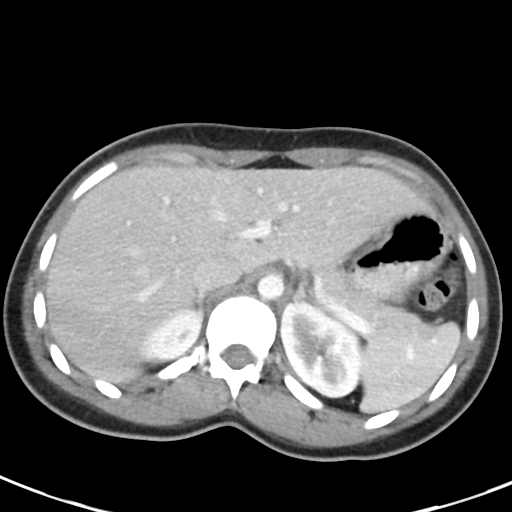
[im 72/82  lung]
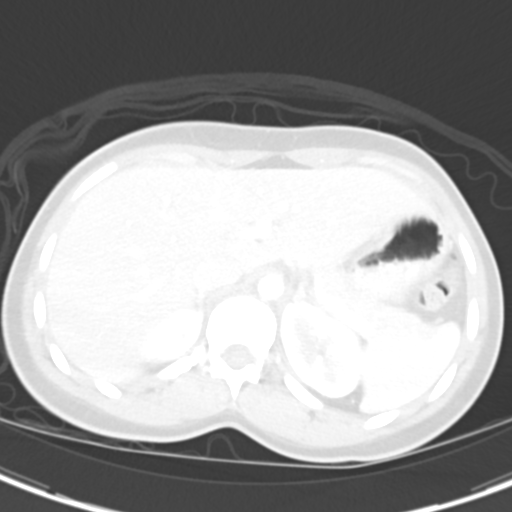
[im 75/82  lung]
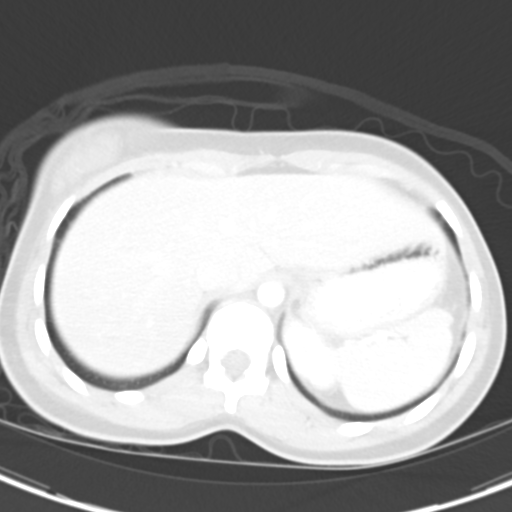
[im 78/82  soft-tissue]
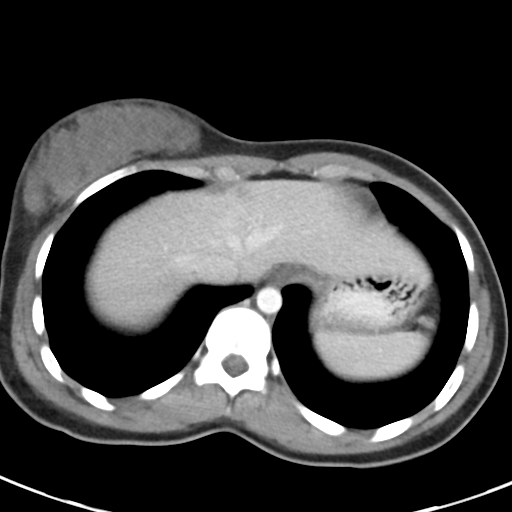
[im 78/82  lung]
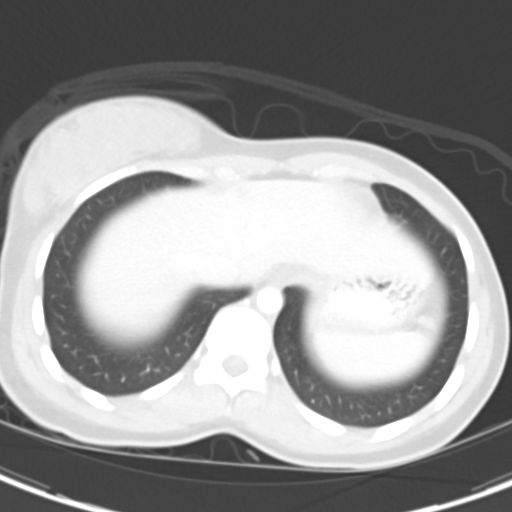

[15 of 32 positions shown; findings below may reference images not displayed]

FINDINGS: Lower chest:  No contributory findings.

Hepatobiliary: No focal liver abnormality.No evidence of biliary
obstruction or stone.

Pancreas: Unremarkable.

Spleen: Unremarkable.

Adrenals/Urinary Tract: Negative adrenals. No hydronephrosis or
stone. Unremarkable bladder.

Stomach/Bowel: No obstruction. No appendicitis or other visible
inflammation. No stool retention.

Vascular/Lymphatic: No acute vascular abnormality. No mass or
adenopathy.

Reproductive:No pathologic findings.

Other: No ascites or pneumoperitoneum.

Musculoskeletal: No acute abnormalities.
IMPRESSION: Negative study.  No explanation for abdominal pain.

## 2022-10-29 DIAGNOSIS — F411 Generalized anxiety disorder: Secondary | ICD-10-CM | POA: Diagnosis not present

## 2022-10-29 DIAGNOSIS — F4322 Adjustment disorder with anxiety: Secondary | ICD-10-CM | POA: Diagnosis not present

## 2022-11-05 DIAGNOSIS — F4322 Adjustment disorder with anxiety: Secondary | ICD-10-CM | POA: Diagnosis not present

## 2022-11-05 DIAGNOSIS — F411 Generalized anxiety disorder: Secondary | ICD-10-CM | POA: Diagnosis not present

## 2022-11-07 ENCOUNTER — Other Ambulatory Visit (HOSPITAL_COMMUNITY): Payer: Self-pay

## 2022-11-07 ENCOUNTER — Other Ambulatory Visit: Payer: Self-pay

## 2022-11-08 ENCOUNTER — Other Ambulatory Visit (HOSPITAL_COMMUNITY): Payer: Self-pay

## 2022-11-11 DIAGNOSIS — F4322 Adjustment disorder with anxiety: Secondary | ICD-10-CM | POA: Diagnosis not present

## 2022-11-11 DIAGNOSIS — F411 Generalized anxiety disorder: Secondary | ICD-10-CM | POA: Diagnosis not present

## 2022-11-12 DIAGNOSIS — N7689 Other specified inflammation of vagina and vulva: Secondary | ICD-10-CM | POA: Diagnosis not present

## 2022-11-12 DIAGNOSIS — R350 Frequency of micturition: Secondary | ICD-10-CM | POA: Diagnosis not present

## 2022-11-12 DIAGNOSIS — Z113 Encounter for screening for infections with a predominantly sexual mode of transmission: Secondary | ICD-10-CM | POA: Diagnosis not present

## 2022-11-12 DIAGNOSIS — L293 Anogenital pruritus, unspecified: Secondary | ICD-10-CM | POA: Diagnosis not present

## 2022-11-18 DIAGNOSIS — F411 Generalized anxiety disorder: Secondary | ICD-10-CM | POA: Diagnosis not present

## 2022-11-18 DIAGNOSIS — F4322 Adjustment disorder with anxiety: Secondary | ICD-10-CM | POA: Diagnosis not present

## 2022-11-25 DIAGNOSIS — F411 Generalized anxiety disorder: Secondary | ICD-10-CM | POA: Diagnosis not present

## 2022-11-25 DIAGNOSIS — F4322 Adjustment disorder with anxiety: Secondary | ICD-10-CM | POA: Diagnosis not present

## 2022-12-02 DIAGNOSIS — F411 Generalized anxiety disorder: Secondary | ICD-10-CM | POA: Diagnosis not present

## 2022-12-02 DIAGNOSIS — F4322 Adjustment disorder with anxiety: Secondary | ICD-10-CM | POA: Diagnosis not present

## 2022-12-09 DIAGNOSIS — F411 Generalized anxiety disorder: Secondary | ICD-10-CM | POA: Diagnosis not present

## 2022-12-09 DIAGNOSIS — F4322 Adjustment disorder with anxiety: Secondary | ICD-10-CM | POA: Diagnosis not present

## 2022-12-17 DIAGNOSIS — F4322 Adjustment disorder with anxiety: Secondary | ICD-10-CM | POA: Diagnosis not present

## 2022-12-17 DIAGNOSIS — F411 Generalized anxiety disorder: Secondary | ICD-10-CM | POA: Diagnosis not present

## 2022-12-25 DIAGNOSIS — F4322 Adjustment disorder with anxiety: Secondary | ICD-10-CM | POA: Diagnosis not present

## 2022-12-25 DIAGNOSIS — F411 Generalized anxiety disorder: Secondary | ICD-10-CM | POA: Diagnosis not present

## 2022-12-31 DIAGNOSIS — F4322 Adjustment disorder with anxiety: Secondary | ICD-10-CM | POA: Diagnosis not present

## 2022-12-31 DIAGNOSIS — F411 Generalized anxiety disorder: Secondary | ICD-10-CM | POA: Diagnosis not present

## 2023-01-08 DIAGNOSIS — F411 Generalized anxiety disorder: Secondary | ICD-10-CM | POA: Diagnosis not present

## 2023-01-08 DIAGNOSIS — F4322 Adjustment disorder with anxiety: Secondary | ICD-10-CM | POA: Diagnosis not present

## 2023-01-14 ENCOUNTER — Other Ambulatory Visit: Payer: Self-pay

## 2023-01-14 DIAGNOSIS — F411 Generalized anxiety disorder: Secondary | ICD-10-CM | POA: Diagnosis not present

## 2023-01-14 DIAGNOSIS — F4322 Adjustment disorder with anxiety: Secondary | ICD-10-CM | POA: Diagnosis not present

## 2023-01-17 ENCOUNTER — Other Ambulatory Visit: Payer: Self-pay

## 2023-01-21 DIAGNOSIS — F4322 Adjustment disorder with anxiety: Secondary | ICD-10-CM | POA: Diagnosis not present

## 2023-01-21 DIAGNOSIS — F411 Generalized anxiety disorder: Secondary | ICD-10-CM | POA: Diagnosis not present

## 2023-01-31 DIAGNOSIS — F411 Generalized anxiety disorder: Secondary | ICD-10-CM | POA: Diagnosis not present

## 2023-01-31 DIAGNOSIS — F4322 Adjustment disorder with anxiety: Secondary | ICD-10-CM | POA: Diagnosis not present

## 2023-02-08 DIAGNOSIS — F411 Generalized anxiety disorder: Secondary | ICD-10-CM | POA: Diagnosis not present

## 2023-02-08 DIAGNOSIS — F4322 Adjustment disorder with anxiety: Secondary | ICD-10-CM | POA: Diagnosis not present

## 2023-02-22 DIAGNOSIS — F4322 Adjustment disorder with anxiety: Secondary | ICD-10-CM | POA: Diagnosis not present

## 2023-02-22 DIAGNOSIS — F411 Generalized anxiety disorder: Secondary | ICD-10-CM | POA: Diagnosis not present

## 2023-03-01 DIAGNOSIS — F4322 Adjustment disorder with anxiety: Secondary | ICD-10-CM | POA: Diagnosis not present

## 2023-03-01 DIAGNOSIS — F411 Generalized anxiety disorder: Secondary | ICD-10-CM | POA: Diagnosis not present

## 2023-03-09 ENCOUNTER — Encounter: Payer: Self-pay | Admitting: Nurse Practitioner

## 2023-03-11 ENCOUNTER — Ambulatory Visit: Payer: Commercial Managed Care - PPO | Admitting: Family Medicine

## 2023-03-11 VITALS — BP 118/70 | HR 87 | Ht 64.0 in | Wt 116.4 lb

## 2023-03-11 DIAGNOSIS — R5383 Other fatigue: Secondary | ICD-10-CM

## 2023-03-11 DIAGNOSIS — E538 Deficiency of other specified B group vitamins: Secondary | ICD-10-CM

## 2023-03-11 DIAGNOSIS — R42 Dizziness and giddiness: Secondary | ICD-10-CM

## 2023-03-11 LAB — LIPID PANEL

## 2023-03-11 NOTE — Progress Notes (Signed)
   Subjective:    Patient ID: Rebecca Griffin, female    DOB: [birth year], 23 y.o.   MRN: [id number]  HPI She is here for consult concerning a 1 week history of difficulty with dizziness and fatigue.  She apparently took a little time out of work because of this.  No blurred vision, double vision, sore throat, earache, fever, chills.  Food has no effect.  There is questionable increase in dizziness if she looks up.  Her cycles are normal.  She also has a previous history of vitamin B12 deficiency.  This is not in her record but she did pull it up from her previous physician then that number was around 200.   Review of Systems     Objective:    Physical Exam Alert and in no distress.  EOMI.  Other cranial nerves intact.  Cerebellar testing normal.  DTRs normal.  Tympanic membranes and canals are normal. Pharyngeal area is normal. Neck is supple without adenopathy or thyromegaly. Cardiac exam shows a regular sinus rhythm without murmurs or gallops. Lungs are clear to auscultation.        Assessment & Plan:  Other fatigue - Plan: CBC with Differential/Platelet, Comprehensive metabolic panel  Dizziness - Plan: Lipid panel, CBC with Differential/Platelet, Comprehensive metabolic panel  B12 deficiency - Plan: Vitamin B12 I explained that it is difficult to say exactly where the dizziness and the fatigue is coming from and we will readdress this issue after blood work.

## 2023-03-12 LAB — LIPID PANEL
Chol/HDL Ratio: 3 ratio (ref 0.0–4.4)
Cholesterol, Total: 216 mg/dL — ABNORMAL HIGH (ref 100–199)
HDL: 71 mg/dL
LDL Chol Calc (NIH): 128 mg/dL — ABNORMAL HIGH (ref 0–99)
Triglycerides: 96 mg/dL (ref 0–149)
VLDL Cholesterol Cal: 17 mg/dL (ref 5–40)

## 2023-03-12 LAB — COMPREHENSIVE METABOLIC PANEL
ALT: 9 IU/L (ref 0–32)
AST: 17 IU/L (ref 0–40)
Albumin: 4.3 g/dL (ref 4.0–5.0)
Alkaline Phosphatase: 72 IU/L (ref 44–121)
BUN/Creatinine Ratio: 15 (ref 9–23)
BUN: 10 mg/dL (ref 6–20)
Bilirubin Total: 0.4 mg/dL (ref 0.0–1.2)
CO2: 21 mmol/L (ref 20–29)
Calcium: 9.4 mg/dL (ref 8.7–10.2)
Chloride: 105 mmol/L (ref 96–106)
Creatinine, Ser: 0.67 mg/dL (ref 0.57–1.00)
Globulin, Total: 3.2 g/dL (ref 1.5–4.5)
Glucose: 79 mg/dL (ref 70–99)
Potassium: 4.4 mmol/L (ref 3.5–5.2)
Sodium: 142 mmol/L (ref 134–144)
Total Protein: 7.5 g/dL (ref 6.0–8.5)
eGFR: 126 mL/min/{1.73_m2} (ref >59)

## 2023-03-12 LAB — CBC WITH DIFFERENTIAL/PLATELET
Basophils Absolute: 0.1 10*3/uL (ref 0.0–0.2)
Basos: 2 %
EOS (ABSOLUTE): 0.1 10*3/uL (ref 0.0–0.4)
Eos: 3 %
Hematocrit: 38.4 % (ref 34.0–46.6)
Hemoglobin: 12.6 g/dL (ref 11.1–15.9)
Immature Grans (Abs): 0 10*3/uL (ref 0.0–0.1)
Immature Granulocytes: 0 %
Lymphocytes Absolute: 1.9 10*3/uL (ref 0.7–3.1)
Lymphs: 46 %
MCH: 26.2 pg — ABNORMAL LOW (ref 26.6–33.0)
MCHC: 32.8 g/dL (ref 31.5–35.7)
MCV: 80 fL (ref 79–97)
Monocytes Absolute: 0.3 10*3/uL (ref 0.1–0.9)
Monocytes: 8 %
Neutrophils Absolute: 1.7 10*3/uL (ref 1.4–7.0)
Neutrophils: 41 %
Platelets: 342 10*3/uL (ref 150–450)
RBC: 4.81 x10E6/uL (ref 3.77–5.28)
RDW: 14.6 % (ref 11.7–15.4)
WBC: 4.1 10*3/uL (ref 3.4–10.8)

## 2023-03-12 LAB — VITAMIN B12: Vitamin B-12: 323 pg/mL (ref 232–1245)

## 2023-03-15 DIAGNOSIS — F411 Generalized anxiety disorder: Secondary | ICD-10-CM | POA: Diagnosis not present

## 2023-03-15 DIAGNOSIS — F4322 Adjustment disorder with anxiety: Secondary | ICD-10-CM | POA: Diagnosis not present

## 2023-03-25 ENCOUNTER — Other Ambulatory Visit (HOSPITAL_COMMUNITY): Payer: Self-pay

## 2023-03-25 ENCOUNTER — Other Ambulatory Visit: Payer: Self-pay

## 2023-03-25 ENCOUNTER — Telehealth: Payer: Self-pay

## 2023-03-25 ENCOUNTER — Telehealth: Payer: Self-pay | Admitting: Nurse Practitioner

## 2023-03-25 DIAGNOSIS — K2100 Gastro-esophageal reflux disease with esophagitis, without bleeding: Secondary | ICD-10-CM

## 2023-03-25 DIAGNOSIS — K582 Mixed irritable bowel syndrome: Secondary | ICD-10-CM

## 2023-03-25 MED ORDER — NORELGESTROMIN-ETH ESTRADIOL 150-35 MCG/24HR TD PTWK
1.0000 | MEDICATED_PATCH | TRANSDERMAL | 3 refills | Status: DC
  Filled 2023-03-25: qty 9, 63d supply, fill #0
  Filled 2023-06-06: qty 9, 63d supply, fill #1
  Filled 2023-08-07: qty 9, 63d supply, fill #2
  Filled 2023-10-28: qty 9, 63d supply, fill #3

## 2023-03-25 NOTE — Telephone Encounter (Signed)
Pt called requesting a new referral to GI. States [facility] told her she needs another

## 2023-03-25 NOTE — Telephone Encounter (Signed)
Needs refill on Zafemy   I did confirm with pharmacy that Rebecca Griffin is out of refills  He said looks like  Rebecca Griffin may have had refilled early at one time

## 2023-03-29 DIAGNOSIS — F411 Generalized anxiety disorder: Secondary | ICD-10-CM | POA: Diagnosis not present

## 2023-03-29 DIAGNOSIS — F4322 Adjustment disorder with anxiety: Secondary | ICD-10-CM | POA: Diagnosis not present

## 2023-04-01 ENCOUNTER — Other Ambulatory Visit (HOSPITAL_COMMUNITY): Payer: Self-pay

## 2023-04-04 ENCOUNTER — Other Ambulatory Visit: Payer: Self-pay | Admitting: Nurse Practitioner

## 2023-04-04 ENCOUNTER — Telehealth: Payer: Self-pay

## 2023-04-04 ENCOUNTER — Other Ambulatory Visit (HOSPITAL_COMMUNITY): Payer: Self-pay

## 2023-04-04 NOTE — Telephone Encounter (Signed)
 [facility] called stating they can not see her. States the only comments wer have her follow up with the GI doctor she was seeing previously. Please advise.

## 2023-04-07 ENCOUNTER — Other Ambulatory Visit (HOSPITAL_COMMUNITY): Payer: Self-pay

## 2023-04-07 MED ORDER — HYOSCYAMINE SULFATE 0.125 MG PO TBDP
0.1250 mg | ORAL_TABLET | Freq: Four times a day (QID) | ORAL | 30 refills | Status: DC | PRN
  Filled 2023-04-07: qty 180, 45d supply, fill #0

## 2023-04-07 MED ORDER — ONDANSETRON 4 MG PO TBDP
4.0000 mg | ORAL_TABLET | Freq: Three times a day (TID) | ORAL | 0 refills | Status: DC | PRN
  Filled 2023-04-07: qty 30, 10d supply, fill #0

## 2023-04-08 ENCOUNTER — Other Ambulatory Visit: Payer: Self-pay

## 2023-04-09 ENCOUNTER — Other Ambulatory Visit: Payer: Self-pay

## 2023-04-12 DIAGNOSIS — F411 Generalized anxiety disorder: Secondary | ICD-10-CM | POA: Diagnosis not present

## 2023-04-12 DIAGNOSIS — F4322 Adjustment disorder with anxiety: Secondary | ICD-10-CM | POA: Diagnosis not present

## 2023-04-14 ENCOUNTER — Other Ambulatory Visit: Payer: Self-pay

## 2023-04-24 DIAGNOSIS — F4322 Adjustment disorder with anxiety: Secondary | ICD-10-CM | POA: Diagnosis not present

## 2023-04-24 DIAGNOSIS — F411 Generalized anxiety disorder: Secondary | ICD-10-CM | POA: Diagnosis not present

## 2023-04-29 ENCOUNTER — Telehealth: Payer: Self-pay | Admitting: Nurse Practitioner

## 2023-04-29 ENCOUNTER — Other Ambulatory Visit: Payer: Self-pay

## 2023-04-29 ENCOUNTER — Other Ambulatory Visit (HOSPITAL_COMMUNITY): Payer: Self-pay

## 2023-04-29 MED ORDER — IPRATROPIUM BROMIDE 0.03 % NA SOLN
2.0000 | Freq: Three times a day (TID) | NASAL | 0 refills | Status: AC
  Filled 2023-04-29: qty 30, 30d supply, fill #0

## 2023-04-29 MED ORDER — ONDANSETRON 4 MG PO TBDP
4.0000 mg | ORAL_TABLET | Freq: Three times a day (TID) | ORAL | 1 refills | Status: AC | PRN
  Filled 2023-04-29 – 2023-09-05 (×3): qty 30, 10d supply, fill #0

## 2023-04-29 MED ORDER — CETIRIZINE HCL 10 MG PO TABS
10.0000 mg | ORAL_TABLET | Freq: Every day | ORAL | 5 refills | Status: AC
  Filled 2023-04-29 – 2023-05-31 (×2): qty 30, 30d supply, fill #0

## 2023-04-29 MED ORDER — LANSOPRAZOLE 30 MG PO TBDD
30.0000 mg | DELAYED_RELEASE_TABLET | Freq: Every day | ORAL | 5 refills | Status: AC
Start: 2023-04-29 — End: ?
  Filled 2023-04-29: qty 30, 30d supply, fill #0

## 2023-04-29 NOTE — Telephone Encounter (Signed)
 Adam please send refills to [facility]

## 2023-04-29 NOTE — Telephone Encounter (Signed)
 Pt needs refill Zofran, Hyoscyamine, Zrytec, Lansoprazole & Ipratropium. I scheduled her for CPE in April.  States she still hasn't been able to get in with GI,  states she can't see [facility] because they don't take her insurance.  She needs a GI who will take [employer].  Will send message to [facility]

## 2023-04-30 ENCOUNTER — Other Ambulatory Visit (HOSPITAL_COMMUNITY): Payer: Self-pay

## 2023-04-30 ENCOUNTER — Other Ambulatory Visit: Payer: Self-pay

## 2023-05-03 DIAGNOSIS — F411 Generalized anxiety disorder: Secondary | ICD-10-CM | POA: Diagnosis not present

## 2023-05-03 DIAGNOSIS — F4322 Adjustment disorder with anxiety: Secondary | ICD-10-CM | POA: Diagnosis not present

## 2023-05-05 ENCOUNTER — Other Ambulatory Visit: Payer: Self-pay

## 2023-05-07 ENCOUNTER — Other Ambulatory Visit (HOSPITAL_COMMUNITY): Payer: Self-pay

## 2023-05-07 ENCOUNTER — Emergency Department
Admission: EM | Admit: 2023-05-07 | Discharge: 2023-05-07 | Disposition: A | Discharge status: Home / Self Care | Attending: Emergency Medicine | Admitting: Emergency Medicine

## 2023-05-07 ENCOUNTER — Other Ambulatory Visit: Payer: Self-pay

## 2023-05-07 ENCOUNTER — Encounter: Payer: Self-pay | Admitting: Medical Oncology

## 2023-05-07 DIAGNOSIS — K602 Anal fissure, unspecified: Secondary | ICD-10-CM | POA: Diagnosis not present

## 2023-05-07 DIAGNOSIS — K5900 Constipation, unspecified: Secondary | ICD-10-CM | POA: Insufficient documentation

## 2023-05-07 DIAGNOSIS — K644 Residual hemorrhoidal skin tags: Secondary | ICD-10-CM | POA: Diagnosis not present

## 2023-05-07 DIAGNOSIS — K625 Hemorrhage of anus and rectum: Secondary | ICD-10-CM

## 2023-05-07 DIAGNOSIS — R1030 Lower abdominal pain, unspecified: Secondary | ICD-10-CM | POA: Diagnosis present

## 2023-05-07 LAB — COMPREHENSIVE METABOLIC PANEL
ALT: 12 U/L (ref 0–44)
AST: 20 U/L (ref 15–41)
Albumin: 3.8 g/dL (ref 3.5–5.0)
Alkaline Phosphatase: 59 U/L (ref 38–126)
Anion gap: 12 (ref 5–15)
BUN: 9 mg/dL (ref 6–20)
CO2: 23 mmol/L (ref 22–32)
Calcium: 9.1 mg/dL (ref 8.9–10.3)
Chloride: 105 mmol/L (ref 98–111)
Creatinine, Ser: 0.52 mg/dL (ref 0.44–1.00)
GFR, Estimated: >60 mL/min (ref >60)
Glucose, Bld: 99 mg/dL (ref 70–99)
Potassium: 4 mmol/L (ref 3.5–5.1)
Sodium: 140 mmol/L (ref 135–145)
Total Bilirubin: 0.5 mg/dL (ref 0.0–1.2)
Total Protein: 8.1 g/dL (ref 6.5–8.1)

## 2023-05-07 LAB — CBC
HCT: 38.1 % (ref 36.0–46.0)
Hemoglobin: 12.2 g/dL (ref 12.0–15.0)
MCH: 25.1 pg — ABNORMAL LOW (ref 26.0–34.0)
MCHC: 32 g/dL (ref 30.0–36.0)
MCV: 78.2 fL — ABNORMAL LOW (ref 80.0–100.0)
Platelets: 354 10*3/uL (ref 150–400)
RBC: 4.87 MIL/uL (ref 3.87–5.11)
RDW: 14.6 % (ref 11.5–15.5)
WBC: 4.7 10*3/uL (ref 4.0–10.5)
nRBC: 0 % (ref 0.0–0.2)

## 2023-05-07 LAB — LIPASE, BLOOD: Lipase: 35 U/L (ref 11–51)

## 2023-05-07 MED ORDER — SITZ BATH MISC
1.0000 [IU] | Freq: Once | 0 refills | Status: AC
  Filled 2023-05-07: qty 1, fill #0

## 2023-05-07 MED ORDER — POLYETHYLENE GLYCOL 3350 17 GM/SCOOP PO POWD
238.0000 g | Freq: Once | ORAL | 0 refills | Status: AC
  Filled 2023-05-07: qty 238, 1d supply, fill #0

## 2023-05-07 MED ORDER — HYOSCYAMINE SULFATE 0.125 MG PO TBDP
0.1250 mg | ORAL_TABLET | Freq: Four times a day (QID) | ORAL | 30 refills | Status: DC | PRN
  Filled 2023-05-07 (×2): qty 180, 45d supply, fill #0

## 2023-05-07 NOTE — ED Provider Notes (Signed)
 [facility] Provider Note   Event Date/Time   First MD Initiated Contact with Patient 05/07/23 1154     (approximate) History  Rectal Bleeding and Abdominal Pain  HPI Rebecca Griffin is a 23 y.o. female with stated past medical history of IBS who presents complaining of rectal bleeding of bright red blood in the toilet with significant constipation recently.  Patient states she has chronic constipation secondary to her bowel disease but has been unable to see gastroenterology for the last few months secondary to moving clinics.  Patient also endorses mild lower abdominal cramping during this time.  Patient states she has not started her period. ROS: Patient currently denies any vision changes, tinnitus, difficulty speaking, facial droop, sore throat, chest pain, shortness of breath, nausea/vomiting/diarrhea, dysuria, or weakness/numbness/paresthesias in any extremity   Physical Exam  Triage Vital Signs: ED Triage Vitals  Encounter Vitals Group     BP 05/07/23 1131 109/80     Systolic BP Percentile --      Diastolic BP Percentile --      Pulse Rate 05/07/23 1131 94     Resp 05/07/23 1131 17     Temp 05/07/23 1131 98.5 F (36.9 C)     Temp Source 05/07/23 1131 Oral     SpO2 05/07/23 1131 99 %     Weight 05/07/23 1129 122 lb (55.3 kg)     Height 05/07/23 1129 5\' 4"  (1.626 m)     Head Circumference --      Peak Flow --      Pain Score 05/07/23 1129 5     Pain Loc --      Pain Education --      Exclude from Growth Chart --    Most recent vital signs: Vitals:   05/07/23 1131  BP: 109/80  Pulse: 94  Resp: 17  Temp: 98.5 F (36.9 C)  SpO2: 99%   General: Awake, oriented x4. CV:  Good peripheral perfusion.  Resp:  Normal effort.  Abd:  No distention.  Rectal:  Nonthrombosed external hemorrhoids with a small 2 millimeter anal fissure at the 12 o'clock position Other:  Young adult well-developed, well-nourished African-American female resting  comfortably in no acute distress ED Results / Procedures / Treatments  Labs (all labs ordered are listed, but only abnormal results are displayed) Labs Reviewed  CBC - Abnormal; Notable for the following components:      Result Value   MCV 78.2 (*)    MCH 25.1 (*)    All other components within normal limits  LIPASE, BLOOD  COMPREHENSIVE METABOLIC PANEL  URINALYSIS, ROUTINE W REFLEX MICROSCOPIC  POC URINE PREG, ED  PROCEDURES: Critical Care performed: No Procedures MEDICATIONS ORDERED IN ED: Medications - No data to display IMPRESSION / MDM / ASSESSMENT AND PLAN / ED COURSE  I reviewed the triage vital signs and the nursing notes.                             The patient is on the cardiac monitor to evaluate for evidence of arrhythmia and/or significant heart rate changes. Patient's presentation is most consistent with acute presentation with potential threat to life or bodily function. Given history and exam patient's presentation most consistent with Lower GI bleed possibly secondary to hemorrhoid, anal fissure, or other nonemergent cause of bleeding. I have low suspicion for Aortoenteric fistula, Upper GI Bleed, IBD, Mesenteric Ischemia, Rectal foreign body or  ulcer.  Workup: CBC, BMP, lipase Results: No evidence of acute abnormalities  Disposition: Discharge. Hemodynamically stable with no gross blood on rectal exam. SRP and prompt PCP follow up.   FINAL CLINICAL IMPRESSION(S) / ED DIAGNOSES   Final diagnoses:  Constipation, unspecified constipation type  Rectal bleeding  Rectal fissure  External hemorrhoids   Rx / DC Orders   ED Discharge Orders          Ordered    hyoscyamine (ANASPAZ) 0.125 MG TBDP disintergrating tablet  Every 6 hours PRN        05/07/23 1226    Misc. Devices (SITZ BATH) MISC   Once        05/07/23 1226    polyethylene glycol powder (GLYCOLAX/MIRALAX) 17 GM/SCOOP powder   Once        05/07/23 1226           Note:  This document was  prepared using Dragon voice recognition software and may include unintentional dictation errors.   Merwyn Katos, MD 05/07/23 1230

## 2023-05-07 NOTE — ED Triage Notes (Signed)
 Pt reports that she began having bright stools Monday and since then has had multiple ones with lower abd cramping.

## 2023-05-07 NOTE — Discharge Instructions (Addendum)
 You may use one half capful twice a day of MiraLAX in order to have 1 solid well-formed bowel movement per day.  You may increase or decrease this dosage as needed to obtain this 1 well-formed bowel movement.  Please make sure that you are drinking at least 8 ounces of water every hour during this initial bowel regimen. Please use over-the-counter sitz bath's with gentle soap such as Dial

## 2023-05-07 NOTE — ED Notes (Signed)
 See triage note  Presents with some rectal bleeding   States she noticed some bright red blood  with some rectal discomfort

## 2023-05-08 ENCOUNTER — Other Ambulatory Visit (HOSPITAL_COMMUNITY): Payer: Self-pay

## 2023-05-08 DIAGNOSIS — F4322 Adjustment disorder with anxiety: Secondary | ICD-10-CM | POA: Diagnosis not present

## 2023-05-08 DIAGNOSIS — F411 Generalized anxiety disorder: Secondary | ICD-10-CM | POA: Diagnosis not present

## 2023-05-10 ENCOUNTER — Other Ambulatory Visit (HOSPITAL_COMMUNITY): Payer: Self-pay

## 2023-05-14 ENCOUNTER — Other Ambulatory Visit (HOSPITAL_COMMUNITY): Payer: Self-pay

## 2023-05-15 ENCOUNTER — Other Ambulatory Visit (HOSPITAL_COMMUNITY): Payer: Self-pay

## 2023-05-15 DIAGNOSIS — F411 Generalized anxiety disorder: Secondary | ICD-10-CM | POA: Diagnosis not present

## 2023-05-15 DIAGNOSIS — F4322 Adjustment disorder with anxiety: Secondary | ICD-10-CM | POA: Diagnosis not present

## 2023-05-16 ENCOUNTER — Other Ambulatory Visit (HOSPITAL_COMMUNITY): Payer: Self-pay

## 2023-05-16 ENCOUNTER — Telehealth: Payer: Self-pay | Admitting: Pharmacy Technician

## 2023-05-16 ENCOUNTER — Other Ambulatory Visit: Payer: Self-pay

## 2023-05-16 NOTE — Telephone Encounter (Signed)
 Pharmacy Patient Advocate Encounter   Received notification from CoverMyMeds that prior authorization for LANSOPRAZOLE 30MG  ODT TABLETS is required/requested.   Insurance verification completed.   The patient is insured through [facility] .   Per test claim: PA required; PA submitted to above mentioned insurance via CoverMyMeds Key/confirmation #/[facility] Status is pending

## 2023-05-19 ENCOUNTER — Other Ambulatory Visit (HOSPITAL_COMMUNITY): Payer: Self-pay

## 2023-05-19 NOTE — Telephone Encounter (Signed)
 Pharmacy Patient Advocate Encounter  Received notification from [facility] that Prior Authorization for LANSOPROZOLE 30MG  ODT TABLETS has been APPROVED from 05/15/2023 to 05/13/2024. Ran test claim, Copay is $54.14. This test claim was processed through [facility]- copay amounts may vary at other pharmacies due to pharmacy/plan contracts, or as the patient moves through the different stages of their insurance plan.   PA #/Case ID/Reference #: [id number]

## 2023-05-20 ENCOUNTER — Other Ambulatory Visit (HOSPITAL_COMMUNITY): Payer: Self-pay

## 2023-05-22 DIAGNOSIS — F4322 Adjustment disorder with anxiety: Secondary | ICD-10-CM | POA: Diagnosis not present

## 2023-05-22 DIAGNOSIS — F411 Generalized anxiety disorder: Secondary | ICD-10-CM | POA: Diagnosis not present

## 2023-05-26 ENCOUNTER — Other Ambulatory Visit (HOSPITAL_COMMUNITY): Payer: Self-pay

## 2023-05-27 ENCOUNTER — Other Ambulatory Visit: Payer: Self-pay

## 2023-05-28 ENCOUNTER — Other Ambulatory Visit: Payer: Self-pay

## 2023-05-28 ENCOUNTER — Encounter: Payer: Self-pay | Admitting: Pharmacist

## 2023-05-31 ENCOUNTER — Other Ambulatory Visit (HOSPITAL_COMMUNITY): Payer: Self-pay

## 2023-06-01 DIAGNOSIS — F4322 Adjustment disorder with anxiety: Secondary | ICD-10-CM | POA: Diagnosis not present

## 2023-06-01 DIAGNOSIS — F411 Generalized anxiety disorder: Secondary | ICD-10-CM | POA: Diagnosis not present

## 2023-06-02 ENCOUNTER — Other Ambulatory Visit: Payer: Self-pay

## 2023-06-06 ENCOUNTER — Other Ambulatory Visit (HOSPITAL_COMMUNITY): Payer: Self-pay

## 2023-06-08 DIAGNOSIS — F411 Generalized anxiety disorder: Secondary | ICD-10-CM | POA: Diagnosis not present

## 2023-06-08 DIAGNOSIS — F4322 Adjustment disorder with anxiety: Secondary | ICD-10-CM | POA: Diagnosis not present

## 2023-06-14 DIAGNOSIS — F4322 Adjustment disorder with anxiety: Secondary | ICD-10-CM | POA: Diagnosis not present

## 2023-06-14 DIAGNOSIS — F411 Generalized anxiety disorder: Secondary | ICD-10-CM | POA: Diagnosis not present

## 2023-06-17 ENCOUNTER — Ambulatory Visit: Admitting: Nurse Practitioner

## 2023-06-17 ENCOUNTER — Encounter: Payer: Self-pay | Admitting: Nurse Practitioner

## 2023-06-17 VITALS — BP 116/74 | HR 82 | Ht 64.0 in | Wt 123.0 lb

## 2023-06-17 DIAGNOSIS — Z1329 Encounter for screening for other suspected endocrine disorder: Secondary | ICD-10-CM | POA: Diagnosis not present

## 2023-06-17 DIAGNOSIS — K219 Gastro-esophageal reflux disease without esophagitis: Secondary | ICD-10-CM

## 2023-06-17 DIAGNOSIS — Z13228 Encounter for screening for other metabolic disorders: Secondary | ICD-10-CM

## 2023-06-17 DIAGNOSIS — L2082 Flexural eczema: Secondary | ICD-10-CM | POA: Diagnosis not present

## 2023-06-17 DIAGNOSIS — K582 Mixed irritable bowel syndrome: Secondary | ICD-10-CM | POA: Diagnosis not present

## 2023-06-17 DIAGNOSIS — J309 Allergic rhinitis, unspecified: Secondary | ICD-10-CM | POA: Diagnosis not present

## 2023-06-17 DIAGNOSIS — K2100 Gastro-esophageal reflux disease with esophagitis, without bleeding: Secondary | ICD-10-CM

## 2023-06-17 DIAGNOSIS — Z1321 Encounter for screening for nutritional disorder: Secondary | ICD-10-CM | POA: Diagnosis not present

## 2023-06-17 DIAGNOSIS — Z0000 Encounter for general adult medical examination without abnormal findings: Secondary | ICD-10-CM | POA: Diagnosis not present

## 2023-06-17 DIAGNOSIS — K5909 Other constipation: Secondary | ICD-10-CM | POA: Diagnosis not present

## 2023-06-17 DIAGNOSIS — Z130 Encounter for screening for diseases of the blood and blood-forming organs and certain disorders involving the immune mechanism: Secondary | ICD-10-CM | POA: Diagnosis not present

## 2023-06-17 LAB — LIPID PANEL

## 2023-06-17 MED ORDER — TRIAMCINOLONE ACETONIDE 0.5 % EX CREA
TOPICAL_CREAM | CUTANEOUS | 3 refills | Status: AC
  Filled 2023-06-17: qty 30, 30d supply, fill #0

## 2023-06-17 NOTE — Patient Instructions (Addendum)
 Call the number from [facility] to get set up with an appointment with the stomach doctor. You need to be seen by them for the constipation issues to find out what is going on to cause this.    I recommend taking the cetirizine  daily and add the ipratropium nasal spray to help with your allergies. You can use saline drops in your eyes to help keep them flushed out. If that doesn't help you can try over the counter allergy eye drops.   I want you to focus on drinking AT LEAST 8- 8 ounce cups of water a day. This is usually about 4 water bottles.  If you are dancing, sweating, or doing activities then you need to drink more. Your body cannot pass stool effectively without the water to help it.   You ca

## 2023-06-17 NOTE — Progress Notes (Signed)
 Dell Fennel, DNP, AGNP-c [facility] [address] [place], [place] [address] Main Office [phone]  BP 116/74   Pulse 82   Ht 5\' 4"  (1.626 m)   Wt 123 lb (55.8 kg)   LMP 06/06/2023   BMI 21.11 kg/m    Subjective:    Patient ID: Rebecca Griffin, female    DOB: Jan 12, 2001, 23 y.o.   MRN: [id number]  HPI: Rebecca Griffin is a 23 y.o. female presenting on 06/17/2023 for comprehensive medical examination.  She has a history of gastritis and possible IBS who with persistent constipation and difficulty with bowel movements.  She has experienced constipation since middle school, requiring laxatives and aids like Nutri-Grain bars and prune juice for temporary relief. Without these, her stool is 'pebbles', but regular with Nutri-Grain bars. Severe constipation has led to emergency room visits due to straining and hemorrhoids.  She uses magnesium  citrate, taking almost a whole bottle to achieve bowel movements. Miralax  and Benefiber have been ineffective. She drinks two to three bottles of water daily but acknowledges this may not be sufficient. Despite increasing fiber intake, she continues to experience constipation and abdominal cramps, impacting her ability to work.  She has a history of gastritis and was previously told she might have IBS. Abdominal cramps, particularly in the lower abdomen, are associated with nausea and a lack of energy. Frequent urination occurs when constipated, possibly due to pressure on the bladder.  She has been unable to fill certain medications due to not having a gastroenterologist, exacerbating her symptoms and affecting work attendance. Medications such as hyaluronidase, Zofran , and Prevacid  are used for stomach cramps and reflux.  She has gained weight recently, from 115 pounds in January to 123 pounds, and wonders if this is related to her gastrointestinal issues. Dietary changes, including a half-vegan diet, avoiding pork and  greasy foods, and increasing vegetable intake, have not alleviated symptoms.  She reports severe seasonal allergies, with symptoms including itchy eyes and nose, for which she takes Zyrtec  and uses a nasal spray. Her allergies have been severe this season, causing significant discomfort.  Pertinent items are noted in HPI.   Most Recent Depression Screen:     06/17/2023    3:16 PM 05/02/2022   10:44 AM  Depression screen PHQ 2/9  Decreased Interest 0 0  Down, Depressed, Hopeless 0 0  PHQ - 2 Score 0 0   Most Recent Anxiety Screen:      No data to display         Most Recent Fall Screen:    06/17/2023    3:16 PM 05/02/2022   10:44 AM  Fall Risk   Falls in the past year? 0 0  Number falls in past yr: 0 0  Injury with Fall? 0 0  Risk for fall due to : No Fall Risks No Fall Risks  Follow up Falls evaluation completed Falls evaluation completed    Past medical history, surgical history, medications, allergies, family history and social history reviewed with patient today and changes made to appropriate areas of the chart.  Past Medical History:  Past Medical History:  Diagnosis Date   Asthma    Constipation    Medications:  Current Outpatient Medications on File Prior to Visit  Medication Sig   aluminum hydroxide-magnesium  carbonate (GAVISCON) 95-358 MG/15ML SUSP Take 30 mLs by mouth as needed.   cetirizine  (ZYRTEC  ALLERGY) 10 MG tablet Take 1 tablet by mouth once daily.   hyoscyamine  (ANASPAZ ) 0.125 MG TBDP  disintergrating tablet Place 1 tablet (0.125 mg total) under the tongue every 6 (six) hours as needed for abdominal cramping   ipratropium (ATROVENT ) 0.03 % nasal spray Place 2 sprays into both nostrils 2 (two) - 3 (three) times daily as directed   lansoprazole  (PREVACID  SOLUTAB) 30 MG disintegrating tablet Dissolve 1 tablet (30 mg total) by mouth daily.   norelgestromin -ethinyl estradiol  (ZAFEMY ) 150-35 MCG/24HR transdermal patch Place 1 patch onto the skin once a week.    ondansetron  (ZOFRAN -ODT) 4 MG disintegrating tablet Dissolve 1 tablet (4 mg total) by mouth every 8 (eight) hours as needed for nausea   Red Yeast Rice Extract 600 MG CAPS Take 1 capsule (600 mg total) by mouth daily.   triamcinolone  cream (KENALOG ) 0.5 % Apply to affected area 2 times a day   No current facility-administered medications on file prior to visit.   Surgical History:  No past surgical history on file. Allergies:  Allergies  Allergen Reactions   Fd&C Red #40 [Red Dye #40 (Allura Red)]    Lactose Other (See Comments)   Lactose Intolerance (Gi)    Family History:  Family History  Problem Relation Age of Onset   Hypertension Mother        Objective:    BP 116/74   Pulse 82   Ht 5\' 4"  (1.626 m)   Wt 123 lb (55.8 kg)   LMP 06/06/2023   BMI 21.11 kg/m   Wt Readings from Last 3 Encounters:  06/17/23 123 lb (55.8 kg)  05/07/23 122 lb (55.3 kg)  03/11/23 116 lb 6.4 oz (52.8 kg)    Physical Exam HENT: PERRL, conjunctiva normal. Normocephalic. TM normal bilaterally. Nasal passages normal. Mouth moist and clear. CV: Heart rate and rhythm normal. S1 and S2 auscultated with no extrasystoles. No murmur or rubs present. Radial and pedal pulses intact and regular. No carotid bruit. No LE edema. Capillary refill 2 to 3 seconds. PULM: Respirations even and unlabored. Lungs clear bilaterally in all fields. No wheezing. NECK: No cervical adenopathy present. Thyroid  normal. GI: Soft. Bowel sounds normal and present in all 4 quadrants. No distention or guarding. Tenderness in lower abdomen. No rebound. MSK: ROM normal. No visible deformities or swelling. No tenderness. SKIN: Warm, dry, intact. No rashes or concerning lesions present. NEURO: Alert and oriented x 4. No weakness or sensory deficits noted. Coordination and gait intact. PSYCH: Mood and affect normal. Cooperative and pleasant.  Results for orders placed or performed during the hospital encounter of 05/07/23   Lipase, blood   Collection Time: 05/07/23 11:47 AM  Result Value Ref Range   Lipase 35 11 - 51 U/L  Comprehensive metabolic panel   Collection Time: 05/07/23 11:47 AM  Result Value Ref Range   Sodium 140 135 - 145 mmol/L   Potassium 4.0 3.5 - 5.1 mmol/L   Chloride 105 98 - 111 mmol/L   CO2 23 22 - 32 mmol/L   Glucose, Bld 99 70 - 99 mg/dL   BUN 9 6 - 20 mg/dL   Creatinine, Ser 4.78 0.44 - 1.00 mg/dL   Calcium 9.1 8.9 - 29.5 mg/dL   Total Protein 8.1 6.5 - 8.1 g/dL   Albumin 3.8 3.5 - 5.0 g/dL   AST 20 15 - 41 U/L   ALT 12 0 - 44 U/L   Alkaline Phosphatase 59 38 - 126 U/L   Total Bilirubin 0.5 0.0 - 1.2 mg/dL   GFR, Estimated >62 >13 mL/min   Anion gap 12 5 - 15  CBC   Collection Time: 05/07/23 11:47 AM  Result Value Ref Range   WBC 4.7 4.0 - 10.5 K/uL   RBC 4.87 3.87 - 5.11 MIL/uL   Hemoglobin 12.2 12.0 - 15.0 g/dL   HCT 91.4 78.2 - 95.6 %   MCV 78.2 (L) 80.0 - 100.0 fL   MCH 25.1 (L) 26.0 - 34.0 pg   MCHC 32.0 30.0 - 36.0 g/dL   RDW 21.3 08.6 - 57.8 %   Platelets 354 150 - 400 K/uL   nRBC 0.0 0.0 - 0.2 %       Assessment & Plan:   Problem List Items Addressed This Visit   None     Follow up plan: No follow-ups on file.  NEXT PREVENTATIVE PHYSICAL DUE IN 1 YEAR.  PATIENT COUNSELING PROVIDED FOR ALL ADULT PATIENTS: A well balanced diet low in saturated fats, cholesterol, and moderation in carbohydrates.  This can be as simple as monitoring portion sizes and cutting back on sugary beverages such as soda and juice to start with.    Daily water consumption of at least 64 ounces.  Physical activity at least 180 minutes per week.  If just starting out, start 10 minutes a day and work your way up.   This can be as simple as taking the stairs instead of the elevator and walking 2-3 laps around the office  purposefully every day.   STD protection, partner selection, and regular testing if high risk.  Limited consumption of alcoholic beverages if alcohol is  consumed. For men, I recommend no more than 14 alcoholic beverages per week, spread out throughout the week (max 2 per day). Avoid "binge" drinking or consuming large quantities of alcohol in one setting.  Please let me know if you feel you may need help with reduction or quitting alcohol consumption.   Avoidance of nicotine, if used. Please let me know if you feel you may need help with reduction or quitting nicotine use.   Daily mental health attention. This can be in the form of 5 minute daily meditation, prayer, journaling, yoga, reflection, etc.  Purposeful attention to your emotions and mental state can significantly improve your overall wellbeing  and  Health.  Please know that I am here to help you with all of your health care goals and am happy to work with you to find a solution that works best for you.  The greatest advice I have received with any changes in life are to take it one step at a time, that even means if all you can focus on is the next 60 seconds, then do that and celebrate your victories.  With any changes in life, you will have set backs, and that is OK. The important thing to remember is, if you have a set back, it is not a failure, it is an opportunity to try again! Screening Testing Mammogram Every 1 -2 years based on history and risk factors Starting at age 46 Pap Smear Ages 21-39 every 3 years Ages 82-65 every 5 years with HPV testing More frequent testing may be required based on results and history Colon Cancer Screening Every 1-10 years based on test performed, risk factors, and history Starting at age 42 Bone Density Screening Every 2-10 years based on history Starting at age 27 for women Recommendations for men differ based on medication usage, history, and risk factors AAA Screening One time ultrasound Men 18-57 years old who have every smoked Lung Cancer Screening Low Dose Lung  CT every 12 months Age 63-80 years with a 30 pack-year smoking  history who still smoke or who have quit within the last 15 years   Screening Labs Routine  Labs: Complete Blood Count (CBC), Complete Metabolic Panel (CMP), Cholesterol (Lipid Panel) Every 6-12 months based on history and medications May be recommended more frequently based on current conditions or previous results Hemoglobin A1c Lab Every 3-12 months based on history and previous results Starting at age 16 or earlier with diagnosis of diabetes, high cholesterol, BMI >26, and/or risk factors Frequent monitoring for patients with diabetes to ensure blood sugar control Thyroid  Panel (TSH) Every 6 months based on history, symptoms, and risk factors May be repeated more often if on medication HIV One time testing for all patients 77 and older May be repeated more frequently for patients with increased risk factors or exposure Hepatitis C One time testing for all patients 56 and older May be repeated more frequently for patients with increased risk factors or exposure Gonorrhea, Chlamydia Every 12 months for all sexually active persons 13-24 years Additional monitoring may be recommended for those who are considered high risk or who have symptoms Every 12 months for any woman on birth control, regardless of sexual activity PSA Men 63-39 years old with risk factors Additional screening may be recommended from age 40-69 based on risk factors, symptoms, and history  Vaccine Recommendations Tetanus Booster All adults every 10 years Flu Vaccine All patients 6 months and older every year COVID Vaccine All patients 12 years and older Initial dosing with booster May recommend additional booster based on age and health history HPV Vaccine 2 doses all patients age 29-26 Dosing may be considered for patients over 26 Shingles Vaccine (Shingrix) 2 doses all adults 55 years and older Pneumonia (Pneumovax 23) All adults 65 years and older May recommend earlier dosing based on health  history One year apart from Prevnar 13 Pneumonia (Prevnar 69) All adults 65 years and older Dosed 1 year after Pneumovax 23 Pneumonia (Prevnar 20) One time alternative to the two dosing of 13 and 23 For all adults with initial dose of 23, 20 is recommended 1 year later For all adults with initial dose of 13, 23 is still recommended as second option 1 year later

## 2023-06-18 ENCOUNTER — Other Ambulatory Visit (HOSPITAL_COMMUNITY): Payer: Self-pay

## 2023-06-18 ENCOUNTER — Other Ambulatory Visit: Payer: Self-pay

## 2023-06-18 LAB — CMP14+EGFR
ALT: 12 IU/L (ref 0–32)
AST: 20 IU/L (ref 0–40)
Albumin: 4.4 g/dL (ref 4.0–5.0)
Alkaline Phosphatase: 86 IU/L (ref 44–121)
BUN/Creatinine Ratio: 12 (ref 9–23)
BUN: 7 mg/dL (ref 6–20)
Bilirubin Total: 0.6 mg/dL (ref 0.0–1.2)
CO2: 23 mmol/L (ref 20–29)
Calcium: 9.4 mg/dL (ref 8.7–10.2)
Chloride: 100 mmol/L (ref 96–106)
Creatinine, Ser: 0.58 mg/dL (ref 0.57–1.00)
Globulin, Total: 3.1 g/dL (ref 1.5–4.5)
Glucose: 75 mg/dL (ref 70–99)
Potassium: 4.3 mmol/L (ref 3.5–5.2)
Sodium: 139 mmol/L (ref 134–144)
Total Protein: 7.5 g/dL (ref 6.0–8.5)
eGFR: 130 mL/min/{1.73_m2} (ref >59)

## 2023-06-18 LAB — LIPID PANEL
Cholesterol, Total: 225 mg/dL — ABNORMAL HIGH (ref 100–199)
HDL: 86 mg/dL (ref >39)
LDL CALC COMMENT:: 2.6 ratio (ref 0.0–4.4)
LDL Chol Calc (NIH): 123 mg/dL — ABNORMAL HIGH (ref 0–99)
Triglycerides: 94 mg/dL (ref 0–149)
VLDL Cholesterol Cal: 16 mg/dL (ref 5–40)

## 2023-06-18 LAB — CBC WITH DIFFERENTIAL/PLATELET
Basophils Absolute: 0.1 10*3/uL (ref 0.0–0.2)
Basos: 1 %
EOS (ABSOLUTE): 0.6 10*3/uL — ABNORMAL HIGH (ref 0.0–0.4)
Eos: 10 %
Hematocrit: 38.4 % (ref 34.0–46.6)
Hemoglobin: 12.2 g/dL (ref 11.1–15.9)
Immature Grans (Abs): 0 10*3/uL (ref 0.0–0.1)
Immature Granulocytes: 0 %
Lymphocytes Absolute: 2.1 10*3/uL (ref 0.7–3.1)
Lymphs: 37 %
MCH: 24.7 pg — ABNORMAL LOW (ref 26.6–33.0)
MCHC: 31.8 g/dL (ref 31.5–35.7)
MCV: 78 fL — ABNORMAL LOW (ref 79–97)
Monocytes Absolute: 0.3 10*3/uL (ref 0.1–0.9)
Monocytes: 6 %
Neutrophils Absolute: 2.7 10*3/uL (ref 1.4–7.0)
Neutrophils: 46 %
Platelets: 311 10*3/uL (ref 150–450)
RBC: 4.94 x10E6/uL (ref 3.77–5.28)
RDW: 14.6 % (ref 11.7–15.4)
WBC: 5.8 10*3/uL (ref 3.4–10.8)

## 2023-06-18 LAB — TSH: TSH: 1 u[IU]/mL (ref 0.450–4.500)

## 2023-06-18 NOTE — Telephone Encounter (Unsigned)
 Copied from CRM [id number]. Topic: Referral - Request for Referral >> Jun 18, 2023  9:08 AM Rosaria Common wrote: Did the patient discuss referral with their provider in the last year? Yes (If No - schedule appointment) (If Yes - send message)  Appointment offered? No  Type of order/referral and detailed reason for visit: GI  Preference of office, provider, location: [facility]  If referral order, have you been seen by this specialty before? No (If Yes, this issue or another issue? When? Where?  Can we respond through MyChart? Yes

## 2023-06-21 DIAGNOSIS — F4322 Adjustment disorder with anxiety: Secondary | ICD-10-CM | POA: Diagnosis not present

## 2023-06-21 DIAGNOSIS — F411 Generalized anxiety disorder: Secondary | ICD-10-CM | POA: Diagnosis not present

## 2023-06-25 ENCOUNTER — Encounter: Payer: Self-pay | Admitting: Nurse Practitioner

## 2023-06-25 DIAGNOSIS — J309 Allergic rhinitis, unspecified: Secondary | ICD-10-CM | POA: Insufficient documentation

## 2023-06-25 DIAGNOSIS — Z0000 Encounter for general adult medical examination without abnormal findings: Secondary | ICD-10-CM | POA: Insufficient documentation

## 2023-06-25 NOTE — Assessment & Plan Note (Signed)

## 2023-06-25 NOTE — Assessment & Plan Note (Signed)
 Long-standing constipation since middle school, requiring laxatives for bowel movements. Symptoms include abdominal cramps, infrequent bowel movements, and dependence on magnesium  citrate. Possible IBS with constipation. Previous attempts with Miralax  and Benefiber were ineffective. Insufficient water intake may contribute to symptoms. Referral issues have delayed gastroenterology evaluation. - Refer to gastroenterology for further evaluation - Increase water intake to at least 64 ounces daily - Consider adding Metamucil or Fibercon for fiber supplementation - Maintain a food and symptom journal for GI consultation

## 2023-06-25 NOTE — Assessment & Plan Note (Signed)
 Chronic allergic rhinitis with symptoms of itchy eyes and nose, exacerbated during allergy season. Current treatment with Zyrtec  and nasal spray is insufficient. - Continue Zyrtec  daily - Use ipratropium nasal spray - Consider switching to levocetirizine if symptoms persist - Use saline drops for eye irritation

## 2023-06-25 NOTE — Assessment & Plan Note (Signed)
 Gastritis with management challenges due to lack of gastroenterology follow-up. Previous medications include hyoscyamine , Zofran , and Prevacid . Difficulty obtaining medications due to referral issues. - Ensure gastroenterology referral is completed - Continue current medications as prescribed - Report symptom changes

## 2023-06-29 DIAGNOSIS — F411 Generalized anxiety disorder: Secondary | ICD-10-CM | POA: Diagnosis not present

## 2023-06-29 DIAGNOSIS — F4322 Adjustment disorder with anxiety: Secondary | ICD-10-CM | POA: Diagnosis not present

## 2023-07-05 DIAGNOSIS — F411 Generalized anxiety disorder: Secondary | ICD-10-CM | POA: Diagnosis not present

## 2023-07-05 DIAGNOSIS — F4322 Adjustment disorder with anxiety: Secondary | ICD-10-CM | POA: Diagnosis not present

## 2023-07-12 DIAGNOSIS — F4322 Adjustment disorder with anxiety: Secondary | ICD-10-CM | POA: Diagnosis not present

## 2023-07-12 DIAGNOSIS — F411 Generalized anxiety disorder: Secondary | ICD-10-CM | POA: Diagnosis not present

## 2023-07-14 DIAGNOSIS — F411 Generalized anxiety disorder: Secondary | ICD-10-CM | POA: Diagnosis not present

## 2023-07-14 DIAGNOSIS — F4322 Adjustment disorder with anxiety: Secondary | ICD-10-CM | POA: Diagnosis not present

## 2023-07-15 ENCOUNTER — Ambulatory Visit: Payer: Self-pay | Admitting: *Deleted

## 2023-07-15 ENCOUNTER — Telehealth: Payer: Self-pay

## 2023-07-15 NOTE — Telephone Encounter (Signed)
 Copied from CRM [id number]. Topic: Clinical - Red Word Triage >> Jul 15, 2023  9:26 AM Donald Frost wrote: Red Word that prompted transfer to Nurse Triage: The patient called in stating she has had bad vaginal itching for a week along with frequent urination and discharge. I will transfer her to E2C2 NT Reason for Disposition  [1] Symptoms of a "yeast infection" (i.e., itchy, white discharge, not bad smelling) AND [2] not improved > 3 days following Care Advice  Answer Assessment - Initial Assessment Questions 1. DISCHARGE: "Describe the discharge." (e.g., white, yellow, green, gray, foamy, cottage cheese-like)     I'm having a yellow discharge vaginally.  It's yellow now white with itching.    I'm urinating frequently.    2. ODOR: "Is there a bad odor?"     No odor 3. ONSET: "When did the discharge begin?"     Last week 4. RASH: "Is there a rash in the genital area?" If Yes, ask: "Describe it." (e.g., redness, blisters, sores, bumps)     No burning or red just itching and discharge,   Pressure over my bladder area too.   Urinating frequently    No blood seen. 5. ABDOMEN PAIN: "Are you having any abdomen pain?" If Yes, ask: "What does it feel like? " (e.g., crampy, dull, intermittent, constant)      Pressure over my  bladder 6. ABDOMEN PAIN SEVERITY: If present, ask: "How bad is it?" (e.g., Scale 1-10; mild, moderate, or severe)   - MILD (1-3): Doesn't interfere with normal activities, abdomen soft and not tender to touch.    - MODERATE (4-7): Interferes with normal activities or awakens from sleep, abdomen tender to touch.    - SEVERE (8-10): Excruciating pain, doubled over, unable to do any normal activities. (R/O peritonitis)      Pressure over my bladder 7. CAUSE: "What do you think is causing the discharge?" "Have you had the same problem before? What happened then?"     Vaginal infection and urinary something too 8. OTHER SYMPTOMS: "Do you have any other symptoms?" (e.g., fever, itching,  vaginal bleeding, pain with urination, injury to genital area, vaginal foreign body)     See above 9. PREGNANCY: "Is there any chance you are pregnant?" "When was your last menstrual period?"     No  Protocols used: Vaginal Discharge-A-AH  Chief Complaint: Vaginal discharge, itching, urinary frequency and urgency. Symptoms: Started last week.  Discharge is yellow to while creamy.  Also having pressure over her bladder area in abd. Frequency: Since last week.   Pertinent Negatives: Patient denies pain with urination Disposition: [] ED /[] Urgent Care (no appt availability in office) / [x] Appointment(In office/virtual)/ []  [facility]/ [] Home Care/ [] Refused Recommended Disposition /[] [facility]/ []  Follow-up with PCP Additional Notes: Pt's mother has to drive her in.   Friday this week is only time but there are no appts with any of the providers on Friday of this week.   I offered her some other appts but they did not work with her mother's work schedule.    Pt. Is going to check with her mother and see exactly when her mother is off and can drive pt in for an appt. And call us  back.     I let her know she did not need to talk with a nurse again but to let the scheduler know to get her scheduled.   Pt. Verbalized understanding and thanked me for my help.

## 2023-07-15 NOTE — Telephone Encounter (Signed)
 Pt. Is scheduled this Friday.   Copied from CRM [id number]. Topic: Appointments - Appointment Info/Confirmation >> Jul 15, 2023 10:08 AM Oddis Bench wrote: Patient/patient representative is calling for information regarding an appointment. She missed call from [place] per the notes Hey Kaydyn,   I tried to call you.  Please call back and ask to speak with the office.  We can work you in Friday. [phone].   Thanks, [address]

## 2023-07-16 ENCOUNTER — Ambulatory Visit: Payer: Self-pay

## 2023-07-16 ENCOUNTER — Telehealth: Payer: Self-pay | Admitting: Nurse Practitioner

## 2023-07-16 NOTE — Telephone Encounter (Signed)
 Copied from CRM [id number]. Topic: Referral - Request for Referral >> Jul 16, 2023  9:12 AM Alpha Arts wrote: Did the patient discuss referral with their provider in the last year? Yes (If No - schedule appointment) (If Yes - send message)  Appointment offered? No  Type of order/referral and detailed reason for visit: Gastroenterology  Preference of office, provider, location: Not [facility]. They have refused to see patient.  If referral order, have you been seen by this specialty before? No (If Yes, this issue or another issue? When? Where?  Can we respond through MyChart? No. Call [phone]

## 2023-07-16 NOTE — Telephone Encounter (Signed)
 Patient called in stating she is having frustrations with not being able to be seen at her [facility] referral. Patient states she is having to call out of work multiple times a week due to her GI issues. Patient states GI referral office has been refusing to see her over her patient records, but she has given release of records. Patient would like a call back asap.   Copied from CRM [id number]. Topic: Clinical - Medication Question >> Jul 16, 2023  3:42 PM Shereese L wrote: Reason for CRM: patient is requesting a call back for her ongoing issue she has had for the past 2 years. Please give patient a call back Reason for Disposition  [1] Follow-up call from patient regarding patient's clinical status AND [2] information NON-URGENT  Answer Assessment - Initial Assessment Questions 1. REASON FOR CALL or QUESTION: "What is your reason for calling today?" or "How can I best help you?" or "What question do you have that I can help answer?"     See notes 2. CALLER: Document the source of call. (e.g., laboratory, patient).     Patient  Protocols used: PCP Call - No Triage-A-AH

## 2023-07-18 ENCOUNTER — Other Ambulatory Visit: Payer: Self-pay

## 2023-07-18 ENCOUNTER — Encounter: Payer: Self-pay | Admitting: Nurse Practitioner

## 2023-07-18 ENCOUNTER — Other Ambulatory Visit (HOSPITAL_COMMUNITY): Payer: Self-pay

## 2023-07-18 ENCOUNTER — Ambulatory Visit: Admitting: Nurse Practitioner

## 2023-07-18 VITALS — BP 112/70 | HR 88 | Wt 123.0 lb

## 2023-07-18 DIAGNOSIS — N898 Other specified noninflammatory disorders of vagina: Secondary | ICD-10-CM | POA: Diagnosis not present

## 2023-07-18 DIAGNOSIS — R102 Pelvic and perineal pain: Secondary | ICD-10-CM

## 2023-07-18 MED ORDER — METRONIDAZOLE 0.75 % VA GEL
1.0000 | Freq: Every day | VAGINAL | 0 refills | Status: DC
  Filled 2023-07-18 – 2023-07-22 (×2): qty 70, 7d supply, fill #0

## 2023-07-18 MED ORDER — NYSTATIN 100000 UNIT/GM EX CREA
1.0000 | TOPICAL_CREAM | Freq: Every evening | CUTANEOUS | 0 refills | Status: DC
  Filled 2023-07-18: qty 30, 30d supply, fill #0
  Filled 2023-07-22: qty 30, 5d supply, fill #0

## 2023-07-18 NOTE — Assessment & Plan Note (Signed)
 No dysuria reported. Urinary tract infection needs to be ruled out as a potential cause of symptoms. - Obtain urine sample to rule out urinary tract infection.

## 2023-07-18 NOTE — Assessment & Plan Note (Signed)
 Intermittent itching and burning with yellow discharge and slight odor for the past week. Differential diagnosis includes bacterial vaginosis. She has been using unscented aloe vera soap externally, which is appropriate. Advised against internal use of soap as the body self-cleanses. - Perform swab test for bacterial vaginosis and yeast infection. Will add routine STI testing. - Educate on external use of mild, unscented soap only. - Will provide prophylactic treatment for BV and yeast, as well.

## 2023-07-18 NOTE — Patient Instructions (Signed)
 You can wash the outside of the vaginal area with unscented soap without any color additives and rinse very well. Do not use soap or douche inside the vagina.    I will let you know if the vaginal swab shows that we need to change the treatment plan. We will start treatment today for bacterial vaginosis and yeast.

## 2023-07-18 NOTE — Progress Notes (Unsigned)
 Rebecca Kief, DNP, AGNP-c [facility] [address] [place], [place] [address] [phone]   ACUTE VISIT- ESTABLISHED PATIENT  Blood pressure 112/70, pulse 88, weight 123 lb (55.8 kg), SpO2 98%.  Subjective:  HPI Rebecca Griffin is a 23 y.o. female presents to day for evaluation of acute concern(s).   History of Present Illness Rebecca Griffin is a 23 year old female who presents with itching, burning, and yellow discharge.  She has been experiencing intermittent itching and burning sensations along with a yellow discharge for the past week. The discharge is not her usual white and has a slight odor, though not fishy. There is no worsening of symptoms after sexual intercourse, which she attempted recently with condom use. She uses liquid ivory soap with aloe vera, which is unscented, for hygiene due to her eczema. No burning during urination.  She has been experiencing ongoing gastrointestinal issues for two years, which have impacted her ability to work consistently. She has faced difficulties in obtaining her medical records from previous gastroenterologists, which has delayed her ability to receive appropriate care. She is actively seeking assistance in transferring her records to facilitate further evaluation and treatment.  She has eczema.  ROS negative except for what is listed in HPI. History, Medications, Surgery, SDOH, and Family History reviewed and updated as appropriate.  Objective:  Physical Exam Vitals and nursing note reviewed. Exam conducted with a chaperone present (Patients BF by request).  Constitutional:      General: She is not in acute distress.    Appearance: Normal appearance. She is normal weight.  Eyes:     Pupils: Pupils are equal, round, and reactive to light.  Cardiovascular:     Rate and Rhythm: Normal rate and regular rhythm.     Pulses: Normal pulses.     Heart sounds: Normal heart sounds.  Pulmonary:     Effort:  Pulmonary effort is normal.     Breath sounds: Normal breath sounds.  Abdominal:     General: Abdomen is flat. Bowel sounds are normal.     Palpations: Abdomen is soft.     Tenderness: There is abdominal tenderness. There is no right CVA tenderness or left CVA tenderness.  Lymphadenopathy:     Cervical: No cervical adenopathy.  Skin:    General: Skin is warm and dry.     Capillary Refill: Capillary refill takes less than 2 seconds.  Neurological:     General: No focal deficit present.     Mental Status: She is alert.  Psychiatric:        Mood and Affect: Mood normal.        Behavior: Behavior normal.         Assessment & Plan:   Problem List Items Addressed This Visit     Vaginal discharge - Primary   Intermittent itching and burning with yellow discharge and slight odor for the past week. Differential diagnosis includes bacterial vaginosis. She has been using unscented aloe vera soap externally, which is appropriate. Advised against internal use of soap as the body self-cleanses. - Perform swab test for bacterial vaginosis and yeast infection. Will add routine STI testing. - Educate on external use of mild, unscented soap only. - Will provide prophylactic treatment for BV and yeast, as well.       Relevant Medications   metroNIDAZOLE  (METROGEL ) 0.75 % vaginal gel   nystatin cream (MYCOSTATIN)   Tenderness of female pelvic organs   No dysuria reported. Urinary tract infection  needs to be ruled out as a potential cause of symptoms. - Obtain urine sample to rule out urinary tract infection.      Relevant Orders   POCT URINALYSIS DIP (CLINITEK)   Urine Culture   STI Profile, CT/NG/TV   NuSwab BV and Candida, NAA      Rebecca Kief, DNP, AGNP-c

## 2023-07-19 DIAGNOSIS — F411 Generalized anxiety disorder: Secondary | ICD-10-CM | POA: Diagnosis not present

## 2023-07-19 DIAGNOSIS — F4322 Adjustment disorder with anxiety: Secondary | ICD-10-CM | POA: Diagnosis not present

## 2023-07-21 LAB — URINE CULTURE

## 2023-07-22 ENCOUNTER — Encounter: Payer: Self-pay | Admitting: Nurse Practitioner

## 2023-07-22 ENCOUNTER — Other Ambulatory Visit (HOSPITAL_COMMUNITY): Payer: Self-pay

## 2023-07-22 ENCOUNTER — Telehealth: Payer: Self-pay

## 2023-07-22 LAB — NUSWAB VG+, HSV
Candida albicans, NAA: NEGATIVE
Candida glabrata, NAA: NEGATIVE

## 2023-07-22 NOTE — Telephone Encounter (Signed)
 Pt. Calling about labs from Friday and treatment plan.   Copied from CRM [id number]. Topic: Clinical - Lab/Test Results >> Jul 22, 2023 10:08 AM Rebecca Griffin D wrote: Reason for CRM: Patient calling to review lab results, advised patient that her results are still pending review from her PCP and to expect a MyChart message. Patient would also like to know what treatment she will be doing.

## 2023-07-23 ENCOUNTER — Ambulatory Visit: Payer: Self-pay | Admitting: Nurse Practitioner

## 2023-07-24 DIAGNOSIS — F411 Generalized anxiety disorder: Secondary | ICD-10-CM | POA: Diagnosis not present

## 2023-07-24 DIAGNOSIS — F4322 Adjustment disorder with anxiety: Secondary | ICD-10-CM | POA: Diagnosis not present

## 2023-07-31 DIAGNOSIS — F4322 Adjustment disorder with anxiety: Secondary | ICD-10-CM | POA: Diagnosis not present

## 2023-07-31 DIAGNOSIS — F411 Generalized anxiety disorder: Secondary | ICD-10-CM | POA: Diagnosis not present

## 2023-08-04 DIAGNOSIS — F4322 Adjustment disorder with anxiety: Secondary | ICD-10-CM | POA: Diagnosis not present

## 2023-08-04 DIAGNOSIS — F411 Generalized anxiety disorder: Secondary | ICD-10-CM | POA: Diagnosis not present

## 2023-08-07 ENCOUNTER — Other Ambulatory Visit (HOSPITAL_COMMUNITY): Payer: Self-pay

## 2023-08-08 DIAGNOSIS — F4322 Adjustment disorder with anxiety: Secondary | ICD-10-CM | POA: Diagnosis not present

## 2023-08-08 DIAGNOSIS — F411 Generalized anxiety disorder: Secondary | ICD-10-CM | POA: Diagnosis not present

## 2023-08-16 DIAGNOSIS — F411 Generalized anxiety disorder: Secondary | ICD-10-CM | POA: Diagnosis not present

## 2023-08-16 DIAGNOSIS — F4322 Adjustment disorder with anxiety: Secondary | ICD-10-CM | POA: Diagnosis not present

## 2023-08-23 DIAGNOSIS — F411 Generalized anxiety disorder: Secondary | ICD-10-CM | POA: Diagnosis not present

## 2023-08-23 DIAGNOSIS — F4322 Adjustment disorder with anxiety: Secondary | ICD-10-CM | POA: Diagnosis not present

## 2023-08-25 ENCOUNTER — Ambulatory Visit: Payer: Self-pay

## 2023-08-25 NOTE — Telephone Encounter (Signed)
 FYI Only or Action Required?: FYI only for provider.  Patient was last seen in primary care on 07/18/2023 by Early, Camie BRAVO, NP. Called Nurse Triage reporting Diarrhea. Symptoms began several weeks ago. Interventions attempted: Nothing. Symptoms are: gradually worsening.  Triage Disposition: See Physician Within 24 Hours  Patient/caregiver understands and will follow disposition?: Yes   Message from Graeme ORN sent at 08/25/2023  4:15 PM EDT  Frequent Diarrhea, cramps, nausea. Has gastro but can not get in until August. Wants to know if she should see PCP or what they suggest. Thank You   Reason for Disposition  [1] MODERATE diarrhea (e.g., 4-6 times / day more than normal) AND [2] present > 48 hours (2 days)  Answer Assessment - Initial Assessment Questions 1. DIARRHEA SEVERITY: How bad is the diarrhea? How many more stools have you had in the past 24 hours than normal?    - NO DIARRHEA (SCALE 0)   - MILD (SCALE 1-3): Few loose or mushy BMs; increase of 1-3 stools over normal daily number of stools; mild increase in ostomy output.   -  MODERATE (SCALE 4-7): Increase of 4-6 stools daily over normal; moderate increase in ostomy output.   -  SEVERE (SCALE 8-10; OR WORST POSSIBLE): Increase of 7 or more stools daily over normal; moderate increase in ostomy output; incontinence.     Mild to moderate 2. ONSET: When did the diarrhea begin?      Years and worsening last several months 3. BM CONSISTENCY: How loose or watery is the diarrhea?      Watery, loose 4. VOMITING: Are you also vomiting? If Yes, ask: How many times in the past 24 hours?      N/v come and goes 5. ABDOMEN PAIN: Are you having any abdomen pain? If Yes, ask: What does it feel like? (e.g., crampy, dull, intermittent, constant)      crampy 6. ABDOMEN PAIN SEVERITY: If present, ask: How bad is the pain?  (e.g., Scale 1-10; mild, moderate, or severe)   - MILD (1-3): doesn't interfere with normal activities,  abdomen soft and not tender to touch    - MODERATE (4-7): interferes with normal activities or awakens from sleep, abdomen tender to touch    - SEVERE (8-10): excruciating pain, doubled over, unable to do any normal activities       Last night 7 ti 8/10 and now it is a 3 or 4/10 7. ORAL INTAKE: If vomiting, Have you been able to drink liquids? How much liquids have you had in the past 24 hours?     yes 8. HYDRATION: Any signs of dehydration? (e.g., dry mouth [not just dry lips], too weak to stand, dizziness, new weight loss) When did you last urinate?     no 9. EXPOSURE: Have you traveled to a foreign country recently? Have you been exposed to anyone with diarrhea? Could you have eaten any food that was spoiled?     N/a 10. ANTIBIOTIC USE: Are you taking antibiotics now or have you taken antibiotics in the past 2 months?       Yes BV and ABX 11. OTHER SYMPTOMS: Do you have any other symptoms? (e.g., fever, blood in stool)       no 12. PREGNANCY: Is there any chance you are pregnant? When was your last menstrual period?       N/a  History of IBS  Protocols used: Diarrhea-A-AH

## 2023-08-27 ENCOUNTER — Other Ambulatory Visit (HOSPITAL_COMMUNITY): Payer: Self-pay

## 2023-08-27 ENCOUNTER — Encounter: Payer: Self-pay | Admitting: Pharmacist

## 2023-08-27 ENCOUNTER — Other Ambulatory Visit: Payer: Self-pay

## 2023-08-27 ENCOUNTER — Encounter: Payer: Self-pay | Admitting: Nurse Practitioner

## 2023-08-27 ENCOUNTER — Telehealth: Admitting: Family Medicine

## 2023-08-27 DIAGNOSIS — K589 Irritable bowel syndrome without diarrhea: Secondary | ICD-10-CM | POA: Diagnosis not present

## 2023-08-27 DIAGNOSIS — F411 Generalized anxiety disorder: Secondary | ICD-10-CM | POA: Diagnosis not present

## 2023-08-27 DIAGNOSIS — F4322 Adjustment disorder with anxiety: Secondary | ICD-10-CM | POA: Diagnosis not present

## 2023-08-27 MED ORDER — DICYCLOMINE HCL 10 MG PO CAPS
10.0000 mg | ORAL_CAPSULE | Freq: Two times a day (BID) | ORAL | 0 refills | Status: AC | PRN
  Filled 2023-08-27 – 2023-09-05 (×2): qty 20, 10d supply, fill #0

## 2023-08-27 NOTE — Progress Notes (Signed)
   Subjective:    Patient ID: Rebecca Griffin, female    DOB: [birth year], 23 y.o.   MRN: [id number]  HPI Documentation for virtual audio and video telecommunications through Caregility encounter:  The patient was located at home. 2 patient identifiers used.  The provider was located in the office. The patient did consent to this visit and is aware of possible charges through their insurance for this visit.  The other persons participating in this telemedicine service were none. Time spent on call was 15 minutes and in review of previous records >5 minutes total for counseling and coordination of care.  This virtual service is not related to other E/M service within previous 7 days.  She is here for evaluation of abdominal cramping some diarrhea and nausea.  She has a long history of difficulty with problems with constipation as well as most recently with diarrhea.  She has been seen in the past in the [facility] for this and was diagnosed with IBS.  Recently she did try Anaspaz  which worked but made her drowsy.  She apparently does have an appoint with a different gastroenterologist for August.   Review of Systems     Objective:    Physical Exam Alert and in no distress otherwise not examined       Assessment & Plan:  Irritable bowel syndrome, unspecified type - Plan: dicyclomine (BENTYL) 10 MG capsule I will try a different antispasmodic to see if that will help but not make her so drowsy.  She is to leave a message on MyChart if there is any further difficulty otherwise keep the appointment in August for follow-up concerning IBS.

## 2023-09-02 ENCOUNTER — Other Ambulatory Visit: Payer: Self-pay

## 2023-09-05 ENCOUNTER — Other Ambulatory Visit (HOSPITAL_COMMUNITY): Payer: Self-pay

## 2023-09-05 ENCOUNTER — Other Ambulatory Visit: Payer: Self-pay

## 2023-09-06 DIAGNOSIS — F411 Generalized anxiety disorder: Secondary | ICD-10-CM | POA: Diagnosis not present

## 2023-09-06 DIAGNOSIS — F4322 Adjustment disorder with anxiety: Secondary | ICD-10-CM | POA: Diagnosis not present

## 2023-09-13 DIAGNOSIS — F4322 Adjustment disorder with anxiety: Secondary | ICD-10-CM | POA: Diagnosis not present

## 2023-09-13 DIAGNOSIS — F411 Generalized anxiety disorder: Secondary | ICD-10-CM | POA: Diagnosis not present

## 2023-09-16 ENCOUNTER — Telehealth: Payer: Self-pay | Admitting: Nurse Practitioner

## 2023-09-16 NOTE — Telephone Encounter (Unsigned)
 Copied from CRM [id number]. Topic: General - Other >> Sep 16, 2023 10:04 AM Graeme ORN wrote: Reason for CRM: Patient called. Had virtual appt. States medication was increased. Makes her drowsy. Would like a note for work. Had to leave early yesterday and miss today. Can email her or call when ready. Will stay on current dose until she sees stomach doctor.  Thank You

## 2023-09-19 ENCOUNTER — Ambulatory Visit (INDEPENDENT_AMBULATORY_CARE_PROVIDER_SITE_OTHER)

## 2023-09-19 ENCOUNTER — Other Ambulatory Visit (HOSPITAL_COMMUNITY): Payer: Self-pay

## 2023-09-19 ENCOUNTER — Ambulatory Visit
Admission: EM | Admit: 2023-09-19 | Discharge: 2023-09-19 | Disposition: A | Discharge status: Home / Self Care | Attending: Family Medicine | Admitting: Family Medicine

## 2023-09-19 DIAGNOSIS — K5909 Other constipation: Secondary | ICD-10-CM

## 2023-09-19 DIAGNOSIS — R109 Unspecified abdominal pain: Secondary | ICD-10-CM | POA: Diagnosis not present

## 2023-09-19 DIAGNOSIS — K582 Mixed irritable bowel syndrome: Secondary | ICD-10-CM

## 2023-09-19 DIAGNOSIS — R1084 Generalized abdominal pain: Secondary | ICD-10-CM

## 2023-09-19 DIAGNOSIS — K588 Other irritable bowel syndrome: Secondary | ICD-10-CM | POA: Diagnosis not present

## 2023-09-19 LAB — POCT URINALYSIS DIP (MANUAL ENTRY)
Bilirubin, UA: NEGATIVE
Blood, UA: NEGATIVE
Glucose, UA: NEGATIVE mg/dL
Leukocytes, UA: NEGATIVE
Nitrite, UA: NEGATIVE
Protein Ur, POC: 30 mg/dL — AB
Spec Grav, UA: >=1.03 — AB (ref 1.010–1.025)
Urobilinogen, UA: 1 U/dL
pH, UA: 5.5 (ref 5.0–8.0)

## 2023-09-19 LAB — POCT URINE PREGNANCY: Preg Test, Ur: NEGATIVE

## 2023-09-19 MED ORDER — GLYCERIN (ADULT) 2 G RE SUPP
1.0000 | Freq: Every day | RECTAL | 0 refills | Status: AC | PRN
  Filled 2023-09-19: qty 8, 8d supply, fill #0

## 2023-09-19 NOTE — Discharge Instructions (Addendum)
 You may do glycerin suppositories once daily as needed for your constipation.  Please follow-up with your PCP if your symptoms do not improve.  Follow-up with your gastroenterologist at your scheduled appointment next month.  Please go to the ER for any worsening symptoms.  Hope you feel better soon!

## 2023-09-19 NOTE — ED Triage Notes (Signed)
 Pt c/o abd cramping for about a week. LBM: yesterday  Home interventions: Zofran , bentyl  (last taken yesterday)

## 2023-09-19 NOTE — ED Provider Notes (Signed)
 [facility]    CSN: [id number] Arrival date & time: 09/19/23  1452      History   Chief Complaint Chief Complaint  Patient presents with   Abdominal Pain    HPI Rebecca Griffin is a 23 y.o. female with past medical history of asthma, IBS, GERD presents for abdominal pain.  Patient reports 1 week of intermittent generalized abdominal pain/cramping.  States she has been having intermittent constipation and goes 3 to 4 days without having a bowel movement.  Her last bowel movement was yesterday and was normal for her.  Denies any nausea/vomiting, dysuria, fevers or chills.  States she has had no appetite today but had previously been eating normally.  She recently was changed from hycosamin to Bentyl  and took her first dose last night which she states was not helpful.  She takes magnesium  citrate for her constipation with last dose 1 week ago.  She has not taken anything additional for constipation.  Denies any history of abdominal surgeries.  She does have a consult with gastroenterology at the end of August.   Abdominal Pain Associated symptoms: constipation     Past Medical History:  Diagnosis Date   Asthma    Constipation     Patient Active Problem List   Diagnosis Date Noted   Vaginal discharge 07/18/2023   Tenderness of female pelvic organs 07/18/2023   Allergic rhinitis 06/25/2023   Encounter for annual physical exam 06/25/2023   Lactose intolerance 05/02/2022   Familial hypercholesterolemia 05/02/2022   Gastroesophageal reflux disease 05/02/2022   Irritable bowel syndrome with both constipation and diarrhea 05/02/2022   Poor dentition 12/24/2021    History reviewed. No pertinent surgical history.  OB History   No obstetric history on file.      Home Medications    Prior to Admission medications   Medication Sig Start Date End Date Taking? Authorizing Provider  glycerin adult 2 g suppository Place 1 suppository rectally daily as needed for  constipation. 09/19/23  Yes Donn Wilmot, Jodi R, NP  aluminum hydroxide-magnesium  carbonate (GAVISCON) 95-358 MG/15ML SUSP Take 30 mLs by mouth as needed. Patient not taking: Reported on 08/27/2023    [provider]  cetirizine  (ZYRTEC  ALLERGY) 10 MG tablet Take 1 tablet by mouth once daily. 04/29/23   Early, Sara E, NP  dicyclomine  (BENTYL ) 10 MG capsule Take 1 capsule (10 mg total) by mouth 2 (two) times daily as needed for spasms. 08/27/23   Lalonde, John C, MD  ipratropium (ATROVENT ) 0.03 % nasal spray Place 2 sprays into both nostrils 2 (two) - 3 (three) times daily as directed 04/29/23   Early, Camie BRAVO, NP  lansoprazole  (PREVACID  SOLUTAB) 30 MG disintegrating tablet Dissolve 1 tablet (30 mg total) by mouth daily. 04/29/23   Early, Sara E, NP  metroNIDAZOLE  (METROGEL ) 0.75 % vaginal gel Place 1 Applicatorful vaginally at bedtime for 5 nights. 07/18/23   Early, Sara E, NP  norelgestromin -ethinyl estradiol  (ZAFEMY ) 150-35 MCG/24HR transdermal patch Place 1 patch onto the skin once a week. 03/25/23     nystatin  cream (MYCOSTATIN ) Apply 1 Application topically at bedtime around the vaginal opening for 5 days. 07/18/23   Early, Sara E, NP  ondansetron  (ZOFRAN -ODT) 4 MG disintegrating tablet Dissolve 1 tablet (4 mg total) by mouth every 8 (eight) hours as needed for nausea 04/29/23   Early, Sara E, NP  triamcinolone  cream (KENALOG ) 0.5 % Apply to affected area 2 times a day 06/17/23   Early, Camie BRAVO, NP  Family History Family History  Problem Relation Age of Onset   Hypertension Mother     Social History Social History   Tobacco Use   Smoking status: Never   Smokeless tobacco: Never  Vaping Use   Vaping status: Never Used  Substance Use Topics   Alcohol use: Never   Drug use: Never     Allergies   Fd&c red #40 [red dye #40 (allura red)], Lactose, and Lactose intolerance (gi)   Review of Systems Review of Systems  Gastrointestinal:  Positive for abdominal pain and constipation.      Physical Exam Triage Vital Signs ED Triage Vitals  Encounter Vitals Group     BP 09/19/23 1510 109/69     Girls Systolic BP Percentile --      Girls Diastolic BP Percentile --      Boys Systolic BP Percentile --      Boys Diastolic BP Percentile --      Pulse Rate 09/19/23 1510 92     Resp 09/19/23 1510 18     Temp 09/19/23 1510 98.2 F (36.8 C)     Temp Source 09/19/23 1510 Oral     SpO2 09/19/23 1510 97 %     Weight --      Height --      Head Circumference --      Peak Flow --      Pain Score 09/19/23 1508 8     Pain Loc --      Pain Education --      Exclude from Growth Chart --    No data found.  Updated Vital Signs BP 109/69 (BP Location: Right Arm)   Pulse 92   Temp 98.2 F (36.8 C) (Oral)   Resp 18   LMP 08/28/2023   SpO2 97%   Visual Acuity Right Eye Distance:   Left Eye Distance:   Bilateral Distance:    Right Eye Near:   Left Eye Near:    Bilateral Near:     Physical Exam Vitals and nursing note reviewed.  Constitutional:      General: She is not in acute distress.    Appearance: Normal appearance. She is not ill-appearing.  HENT:     Head: Normocephalic and atraumatic.  Eyes:     Pupils: Pupils are equal, round, and reactive to light.  Cardiovascular:     Rate and Rhythm: Normal rate.  Pulmonary:     Effort: Pulmonary effort is normal.  Abdominal:     General: Bowel sounds are increased.     Palpations: There is no hepatomegaly or splenomegaly.     Tenderness: There is generalized abdominal tenderness. There is no right CVA tenderness, left CVA tenderness, guarding or rebound.  Skin:    General: Skin is warm and dry.  Neurological:     General: No focal deficit present.     Mental Status: She is alert and oriented to person, place, and time.  Psychiatric:        Mood and Affect: Mood normal.        Behavior: Behavior normal.      UC Treatments / Results  Labs (all labs ordered are listed, but only abnormal results are  displayed) Labs Reviewed  POCT URINALYSIS DIP (MANUAL ENTRY) - Abnormal; Notable for the following components:      Result Value   Ketones, POC UA moderate (40) (*)    Spec Grav, UA >=1.030 (*)    Protein Ur, POC =30 (*)  All other components within normal limits  POCT URINE PREGNANCY    EKG   Radiology DG Abd 1 View Result Date: 09/19/2023 CLINICAL DATA:  abd pain, cramping, hx IBS EXAM: ABDOMEN - 1 VIEW COMPARISON:  May 27, 2020, April 09, 2020 FINDINGS: Nonobstructive bowel gas pattern. No pneumoperitoneum. No organomegaly or radiopaque calculi. No acute fracture or destructive lesion. The lung bases are clear. IMPRESSION: Nonobstructive bowel gas pattern. Electronically Signed   By: Rogelia Myers M.D.   On: 09/19/2023 15:55    Procedures Procedures (including critical care time)  Medications Ordered in UC Medications - No data to display  Initial Impression / Assessment and Plan / UC Course  I have reviewed the triage vital signs and the nursing notes.  Pertinent labs & imaging results that were available during my care of the patient were reviewed by me and considered in my medical decision making (see chart for details).     Reviewed exam and symptoms with patient.  No red flags.  Abdominal x-ray with nonobstructive bowel gas pattern/constipation.  Will do glycerin suppositories and advised her to increase hydration and she repeat her magnesium  citrate as needed if the suppositories are not helpful.  Advised her to follow-up with her PCP if her symptoms do not improve.  She also has to follow-up with her GI at her scheduled appointment next month.  ER precautions reviewed. Final Clinical Impressions(s) / UC Diagnoses   Final diagnoses:  Generalized abdominal pain  Other constipation  Irritable bowel syndrome with both constipation and diarrhea     Discharge Instructions      You may do glycerin suppositories once daily as needed for your constipation.   Please follow-up with your PCP if your symptoms do not improve.  Follow-up with your gastroenterologist at your scheduled appointment next month.  Please go to the ER for any worsening symptoms.  Hope you feel better soon!     ED Prescriptions     Medication Sig Dispense Auth. Provider   glycerin adult 2 g suppository Place 1 suppository rectally daily as needed for constipation. 8 suppository Gene Colee, Jodi R, NP      PDMP not reviewed this encounter.   Loreda Myla SAUNDERS, NP 09/19/23 [phone]

## 2023-09-21 DIAGNOSIS — F411 Generalized anxiety disorder: Secondary | ICD-10-CM | POA: Diagnosis not present

## 2023-09-21 DIAGNOSIS — F4322 Adjustment disorder with anxiety: Secondary | ICD-10-CM | POA: Diagnosis not present

## 2023-09-27 DIAGNOSIS — F4322 Adjustment disorder with anxiety: Secondary | ICD-10-CM | POA: Diagnosis not present

## 2023-09-27 DIAGNOSIS — F411 Generalized anxiety disorder: Secondary | ICD-10-CM | POA: Diagnosis not present

## 2023-10-07 DIAGNOSIS — F411 Generalized anxiety disorder: Secondary | ICD-10-CM | POA: Diagnosis not present

## 2023-10-07 DIAGNOSIS — F4322 Adjustment disorder with anxiety: Secondary | ICD-10-CM | POA: Diagnosis not present

## 2023-10-14 DIAGNOSIS — F411 Generalized anxiety disorder: Secondary | ICD-10-CM | POA: Diagnosis not present

## 2023-10-14 DIAGNOSIS — F4322 Adjustment disorder with anxiety: Secondary | ICD-10-CM | POA: Diagnosis not present

## 2023-10-20 ENCOUNTER — Ambulatory Visit: Payer: Self-pay

## 2023-10-20 ENCOUNTER — Emergency Department (HOSPITAL_BASED_OUTPATIENT_CLINIC_OR_DEPARTMENT_OTHER)
Admission: EM | Admit: 2023-10-20 | Discharge: 2023-10-20 | Disposition: A | Discharge status: Home / Self Care | Attending: Emergency Medicine | Admitting: Emergency Medicine

## 2023-10-20 ENCOUNTER — Other Ambulatory Visit: Payer: Self-pay

## 2023-10-20 ENCOUNTER — Encounter (HOSPITAL_BASED_OUTPATIENT_CLINIC_OR_DEPARTMENT_OTHER): Payer: Self-pay

## 2023-10-20 DIAGNOSIS — K648 Other hemorrhoids: Secondary | ICD-10-CM | POA: Diagnosis not present

## 2023-10-20 DIAGNOSIS — K649 Unspecified hemorrhoids: Secondary | ICD-10-CM | POA: Diagnosis not present

## 2023-10-20 DIAGNOSIS — F411 Generalized anxiety disorder: Secondary | ICD-10-CM | POA: Diagnosis not present

## 2023-10-20 DIAGNOSIS — F4322 Adjustment disorder with anxiety: Secondary | ICD-10-CM | POA: Diagnosis not present

## 2023-10-20 DIAGNOSIS — K625 Hemorrhage of anus and rectum: Secondary | ICD-10-CM | POA: Diagnosis present

## 2023-10-20 LAB — CBC WITH DIFFERENTIAL/PLATELET
Abs Immature Granulocytes: 0.01 K/uL (ref 0.00–0.07)
Basophils Absolute: 0.1 K/uL (ref 0.0–0.1)
Basophils Relative: 1 %
Eosinophils Absolute: 0.2 K/uL (ref 0.0–0.5)
Eosinophils Relative: 5 %
HCT: 36.2 % (ref 36.0–46.0)
Hemoglobin: 11.7 g/dL — ABNORMAL LOW (ref 12.0–15.0)
Immature Granulocytes: 0 %
Lymphocytes Relative: 43 %
Lymphs Abs: 2.1 K/uL (ref 0.7–4.0)
MCH: 24.6 pg — ABNORMAL LOW (ref 26.0–34.0)
MCHC: 32.3 g/dL (ref 30.0–36.0)
MCV: 76.1 fL — ABNORMAL LOW (ref 80.0–100.0)
Monocytes Absolute: 0.4 K/uL (ref 0.1–1.0)
Monocytes Relative: 8 %
Neutro Abs: 2.1 K/uL (ref 1.7–7.7)
Neutrophils Relative %: 43 %
Platelets: 426 K/uL — ABNORMAL HIGH (ref 150–400)
RBC: 4.76 MIL/uL (ref 3.87–5.11)
RDW: 15.3 % (ref 11.5–15.5)
WBC: 4.8 K/uL (ref 4.0–10.5)
nRBC: 0 % (ref 0.0–0.2)

## 2023-10-20 LAB — COMPREHENSIVE METABOLIC PANEL WITH GFR
ALT: 8 U/L (ref 0–44)
AST: 18 U/L (ref 15–41)
Albumin: 4.4 g/dL (ref 3.5–5.0)
Alkaline Phosphatase: 75 U/L (ref 38–126)
Anion gap: 14 (ref 5–15)
BUN: 9 mg/dL (ref 6–20)
CO2: 22 mmol/L (ref 22–32)
Calcium: 9.5 mg/dL (ref 8.9–10.3)
Chloride: 103 mmol/L (ref 98–111)
Creatinine, Ser: 0.59 mg/dL (ref 0.44–1.00)
GFR, Estimated: >60 mL/min (ref >60)
Glucose, Bld: 81 mg/dL (ref 70–99)
Potassium: 3.9 mmol/L (ref 3.5–5.1)
Sodium: 139 mmol/L (ref 135–145)
Total Bilirubin: 0.3 mg/dL (ref 0.0–1.2)
Total Protein: 8 g/dL (ref 6.5–8.1)

## 2023-10-20 LAB — OCCULT BLOOD X 1 CARD TO LAB, STOOL: Fecal Occult Bld: NEGATIVE

## 2023-10-20 MED ORDER — HYDROCORTISONE (PERIANAL) 2.5 % EX CREA: 1.0000 | TOPICAL_CREAM | Freq: Two times a day (BID) | CUTANEOUS | 0 refills | Status: DC

## 2023-10-20 NOTE — ED Triage Notes (Signed)
 Pt c/o rectal bleeding x4 days, black stool onset today. Severe stomach cramps, urge to go to the bathroom. Pt denies dizziness/ feeling lightheaded, SHOB. Advises intermittent not severe nausea. Hx of same, dx internal hemorrhoids  Follow up w [place] on Wednesday

## 2023-10-20 NOTE — Telephone Encounter (Signed)
 FYI Only or Action Required?: Action required by provider: Advised ED.  Patient was last seen in primary care on 08/27/2023 by Joyce Norleen BROCKS, MD.  Called Nurse Triage reporting Rectal Bleeding.  Symptoms began several days ago.  Interventions attempted: Nothing.  Symptoms are: gradually worsening.  Triage Disposition: Go to ED Now (Notify PCP)  Patient/caregiver understands and will follow disposition?: Yes      Copied from CRM [id number]. Topic: Clinical - Red Word Triage >> Oct 20, 2023  3:46 PM Kevelyn M wrote: Red Word that prompted transfer to Nurse Triage: Past few days have been bleeding from rectum and stool was dark. Has an appointment for Gastro on Wednesday. Wants to know if she should go to ER or wait until Wednesday for appointment. Reason for Disposition  Black or tarry bowel movements  (Exception: Chronic-unchanged black-grey BMs AND is taking iron pills or Pepto-Bismol.)  Answer Assessment - Initial Assessment Questions Advised ED. Patient's mother will drive to ED for evaluation.  Patient reports black stool. Patient reports rectal bleeding, abdominal pain and nausea. Denies lightheadedness/faint, V/D  Deal with constant chronic constipation, bleeding through pants, stool dark  Denies lightehad/ pass out, V/D  Reports nausea,  Last ate drink; this morning Abp pain; below belly, cramping going across  1. APPEARANCE of BLOOD: What color is it? Is it passed separately, on the surface of the stool, or mixed in with the stool?      Stool looks black today, Saturday bleeding in toliet and bleeding through pants, small clot. 2. AMOUNT: How much blood was passed?      Unsure, bleeding through clothes Saturday 3. FREQUENCY: How many times has blood been passed with the stools?     3x since Saturday. Today black stools 4. ONSET: When was the blood first seen in the stools? (Days or weeks)      Started last week 5. DIARRHEA: Is there also some diarrhea?  If Yes, ask: How many diarrhea stools in the past 24 hours?      no 6. CONSTIPATION: Do you have constipation? If Yes, ask: How bad is it?     Off and on ; dx IBS 7. RECURRENT SYMPTOMS: Have you had blood in your stools before? If Yes, ask: When was the last time? and What happened that time?      It has happened before 8. BLOOD THINNERS: Do you take any blood thinners? (e.g., aspirin, clopidogrel / Plavix, coumadin, heparin). Notes: Other strong blood thinners include: Arixtra (fondaparinux), Eliquis (apixaban), Pradaxa (dabigatran), and Xarelto (rivaroxaban).    No blood thinners or pepto bismol 9. OTHER SYMPTOMS: Do you have any other symptoms?  (e.g., abdomen pain, vomiting, dizziness, fever)     Abd pain, 8/10. denies vomiting, dizziness 10. PREGNANCY: Is there any chance you are pregnant? When was your last menstrual period?       09/26/23  Protocols used: Rectal Bleeding-A-AH

## 2023-10-20 NOTE — ED Provider Notes (Signed)
 [facility] AT [facility] Provider Note   CSN: [id number] Arrival date & time: 10/20/23  1724     Patient presents with: Rectal Bleeding   Rebecca Griffin is a 23 y.o. female.  {Add pertinent medical, surgical, social history, OB history to YEP:67052}  Rectal Bleeding      Prior to Admission medications   Medication Sig Start Date End Date Taking? Authorizing Provider  aluminum hydroxide-magnesium  carbonate (GAVISCON) 95-358 MG/15ML SUSP Take 30 mLs by mouth as needed. Patient not taking: Reported on 08/27/2023    [provider]  cetirizine  (ZYRTEC  ALLERGY) 10 MG tablet Take 1 tablet by mouth once daily. 04/29/23   Early, Sara E, NP  dicyclomine  (BENTYL ) 10 MG capsule Take 1 capsule (10 mg total) by mouth 2 (two) times daily as needed for spasms. 08/27/23   Joyce Norleen BROCKS, MD  glycerin  adult 2 g suppository Place 1 suppository rectally daily as needed for constipation. 09/19/23   Mayer, Jodi R, NP  ipratropium (ATROVENT ) 0.03 % nasal spray Place 2 sprays into both nostrils 2 (two) - 3 (three) times daily as directed 04/29/23   Early, Camie BRAVO, NP  lansoprazole  (PREVACID  SOLUTAB) 30 MG disintegrating tablet Dissolve 1 tablet (30 mg total) by mouth daily. 04/29/23   Early, Sara E, NP  metroNIDAZOLE  (METROGEL ) 0.75 % vaginal gel Place 1 Applicatorful vaginally at bedtime for 5 nights. 07/18/23   Early, Sara E, NP  norelgestromin -ethinyl estradiol  (ZAFEMY ) 150-35 MCG/24HR transdermal patch Place 1 patch onto the skin once a week. 03/25/23     nystatin  cream (MYCOSTATIN ) Apply 1 Application topically at bedtime around the vaginal opening for 5 days. 07/18/23   Early, Sara E, NP  ondansetron  (ZOFRAN -ODT) 4 MG disintegrating tablet Dissolve 1 tablet (4 mg total) by mouth every 8 (eight) hours as needed for nausea 04/29/23   Early, Sara E, NP  triamcinolone  cream (KENALOG ) 0.5 % Apply to affected area 2 times a day 06/17/23   Early, Camie BRAVO, NP    Allergies: Fd&c  red #40 [red dye #40 (allura red)], Lactose, and Lactose intolerance (gi)    Review of Systems  Gastrointestinal:  Positive for hematochezia.    Updated Vital Signs BP 112/78 (BP Location: Right Arm)   Pulse 87   Temp 98.1 F (36.7 C) (Oral)   Resp 16   SpO2 99%   Physical Exam  (all labs ordered are listed, but only abnormal results are displayed) Labs Reviewed  CBC WITH DIFFERENTIAL/PLATELET - Abnormal; Notable for the following components:      Result Value   Hemoglobin 11.7 (*)    MCV 76.1 (*)    MCH 24.6 (*)    Platelets 426 (*)    All other components within normal limits  COMPREHENSIVE METABOLIC PANEL WITH GFR    EKG: None  Radiology: No results found.  {Document cardiac monitor, telemetry assessment procedure when appropriate:32947} Procedures   Medications Ordered in the ED - No data to display    {Click here for ABCD2, HEART and other calculators REFRESH Note before signing:1}                              Medical Decision Making Amount and/or Complexity of Data Reviewed Labs: ordered.   ***  {Document critical care time when appropriate  Document review of labs and clinical decision tools ie CHADS2VASC2, etc  Document your independent review of radiology images and any outside  records  Document your discussion with family members, caretakers and with consultants  Document social determinants of health affecting pt's care  Document your decision making why or why not admission, treatments were needed:32947:::1}   Final diagnoses:  None    ED Discharge Orders     None

## 2023-10-20 NOTE — ED Notes (Signed)
 Rectal exam preformed by MD.  RN at bedside during exam

## 2023-10-20 NOTE — Discharge Instructions (Addendum)
 Your exam revealed evidence of an internal hemorrhoid for which we recommend you trial Anusol  cream and sitz bath's.  Follow-up with your gastroenterologist in the next few days.  For chronic hemorrhoid management, and outpatient referral for follow-up with general surgery has been placed.

## 2023-10-21 ENCOUNTER — Other Ambulatory Visit (HOSPITAL_COMMUNITY): Payer: Self-pay

## 2023-10-21 ENCOUNTER — Telehealth (HOSPITAL_BASED_OUTPATIENT_CLINIC_OR_DEPARTMENT_OTHER): Payer: Self-pay | Admitting: Emergency Medicine

## 2023-10-21 MED ORDER — HYDROCORTISONE (PERIANAL) 2.5 % EX CREA
1.0000 | TOPICAL_CREAM | Freq: Two times a day (BID) | CUTANEOUS | 0 refills | Status: AC
Start: 2023-10-21 — End: ?
  Filled 2023-10-21 – 2023-10-29 (×2): qty 30, 10d supply, fill #0

## 2023-10-21 NOTE — Telephone Encounter (Cosign Needed)
 I was asked to prescribe meds to [facility] outpatient pharmacy.  This was completed.

## 2023-10-22 ENCOUNTER — Other Ambulatory Visit (HOSPITAL_COMMUNITY): Payer: Self-pay

## 2023-10-22 ENCOUNTER — Other Ambulatory Visit: Payer: Self-pay

## 2023-10-22 DIAGNOSIS — K219 Gastro-esophageal reflux disease without esophagitis: Secondary | ICD-10-CM | POA: Diagnosis not present

## 2023-10-22 DIAGNOSIS — K625 Hemorrhage of anus and rectum: Secondary | ICD-10-CM | POA: Diagnosis not present

## 2023-10-22 DIAGNOSIS — K582 Mixed irritable bowel syndrome: Secondary | ICD-10-CM | POA: Diagnosis not present

## 2023-10-22 MED ORDER — GAVILYTE-C 240 G PO SOLR
ORAL | 0 refills | Status: AC
  Filled 2023-10-22: qty 4000, 1d supply, fill #0

## 2023-10-22 MED ORDER — FAMOTIDINE 40 MG/5ML PO SUSR
40.0000 mg | Freq: Every day | ORAL | 5 refills | Status: AC
  Filled 2023-10-22: qty 150, 30d supply, fill #0

## 2023-10-22 MED ORDER — LACTULOSE ENCEPHALOPATHY 10 GM/15ML PO SOLN
20.0000 g | Freq: Three times a day (TID) | ORAL | 0 refills | Status: AC
  Filled 2023-10-22: qty 2700, 30d supply, fill #0

## 2023-10-25 ENCOUNTER — Other Ambulatory Visit (HOSPITAL_COMMUNITY): Payer: Self-pay

## 2023-10-26 DIAGNOSIS — F411 Generalized anxiety disorder: Secondary | ICD-10-CM | POA: Diagnosis not present

## 2023-10-26 DIAGNOSIS — F4322 Adjustment disorder with anxiety: Secondary | ICD-10-CM | POA: Diagnosis not present

## 2023-10-28 ENCOUNTER — Other Ambulatory Visit (HOSPITAL_COMMUNITY): Payer: Self-pay

## 2023-10-29 ENCOUNTER — Other Ambulatory Visit (HOSPITAL_COMMUNITY): Payer: Self-pay

## 2023-10-29 ENCOUNTER — Other Ambulatory Visit: Payer: Self-pay

## 2023-11-05 DIAGNOSIS — F4322 Adjustment disorder with anxiety: Secondary | ICD-10-CM | POA: Diagnosis not present

## 2023-11-05 DIAGNOSIS — F411 Generalized anxiety disorder: Secondary | ICD-10-CM | POA: Diagnosis not present

## 2023-11-09 DIAGNOSIS — F4322 Adjustment disorder with anxiety: Secondary | ICD-10-CM | POA: Diagnosis not present

## 2023-11-09 DIAGNOSIS — F411 Generalized anxiety disorder: Secondary | ICD-10-CM | POA: Diagnosis not present

## 2023-11-16 DIAGNOSIS — F411 Generalized anxiety disorder: Secondary | ICD-10-CM | POA: Diagnosis not present

## 2023-11-16 DIAGNOSIS — F4322 Adjustment disorder with anxiety: Secondary | ICD-10-CM | POA: Diagnosis not present

## 2023-11-23 DIAGNOSIS — F411 Generalized anxiety disorder: Secondary | ICD-10-CM | POA: Diagnosis not present

## 2023-11-23 DIAGNOSIS — F4322 Adjustment disorder with anxiety: Secondary | ICD-10-CM | POA: Diagnosis not present

## 2023-12-03 DIAGNOSIS — F411 Generalized anxiety disorder: Secondary | ICD-10-CM | POA: Diagnosis not present

## 2023-12-03 DIAGNOSIS — F4322 Adjustment disorder with anxiety: Secondary | ICD-10-CM | POA: Diagnosis not present

## 2023-12-10 DIAGNOSIS — F411 Generalized anxiety disorder: Secondary | ICD-10-CM | POA: Diagnosis not present

## 2023-12-10 DIAGNOSIS — F4322 Adjustment disorder with anxiety: Secondary | ICD-10-CM | POA: Diagnosis not present

## 2023-12-18 DIAGNOSIS — F411 Generalized anxiety disorder: Secondary | ICD-10-CM | POA: Diagnosis not present

## 2023-12-18 DIAGNOSIS — F4322 Adjustment disorder with anxiety: Secondary | ICD-10-CM | POA: Diagnosis not present

## 2023-12-23 ENCOUNTER — Other Ambulatory Visit (HOSPITAL_COMMUNITY): Payer: Self-pay

## 2023-12-23 ENCOUNTER — Other Ambulatory Visit: Payer: Self-pay | Admitting: Nurse Practitioner

## 2023-12-25 ENCOUNTER — Other Ambulatory Visit (HOSPITAL_COMMUNITY): Payer: Self-pay

## 2023-12-26 ENCOUNTER — Encounter (HOSPITAL_COMMUNITY): Payer: Self-pay

## 2023-12-26 ENCOUNTER — Other Ambulatory Visit (HOSPITAL_COMMUNITY): Payer: Self-pay

## 2023-12-26 DIAGNOSIS — F411 Generalized anxiety disorder: Secondary | ICD-10-CM | POA: Diagnosis not present

## 2023-12-26 DIAGNOSIS — F4322 Adjustment disorder with anxiety: Secondary | ICD-10-CM | POA: Diagnosis not present

## 2023-12-31 ENCOUNTER — Other Ambulatory Visit: Payer: Self-pay

## 2023-12-31 ENCOUNTER — Encounter: Payer: Self-pay | Admitting: Nurse Practitioner

## 2023-12-31 ENCOUNTER — Other Ambulatory Visit (HOSPITAL_COMMUNITY): Payer: Self-pay

## 2023-12-31 ENCOUNTER — Telehealth: Payer: Self-pay

## 2023-12-31 MED ORDER — NORELGESTROMIN-ETH ESTRADIOL 150-35 MCG/24HR TD PTWK
1.0000 | MEDICATED_PATCH | TRANSDERMAL | 3 refills | Status: AC
  Filled 2023-12-31: qty 9, 63d supply, fill #0
  Filled 2024-04-01: qty 12, 84d supply, fill #1

## 2023-12-31 NOTE — Telephone Encounter (Signed)
 MyChart message already sent to pt. That, that medication is filled by her obgyn doctor.    Copied from CRM [id number]. Topic: Clinical - Prescription Issue >> Dec 31, 2023  3:48 PM Rebecca Griffin wrote: Reason for CRM: Patient request to speak to provider concerning medication Zafemy , the pharmacy states its been denied by provider And would like a return call asap .

## 2024-01-01 ENCOUNTER — Other Ambulatory Visit (HOSPITAL_COMMUNITY): Payer: Self-pay

## 2024-01-01 ENCOUNTER — Other Ambulatory Visit: Payer: Self-pay

## 2024-01-03 DIAGNOSIS — F411 Generalized anxiety disorder: Secondary | ICD-10-CM | POA: Diagnosis not present

## 2024-01-03 DIAGNOSIS — F4322 Adjustment disorder with anxiety: Secondary | ICD-10-CM | POA: Diagnosis not present

## 2024-01-10 DIAGNOSIS — F4322 Adjustment disorder with anxiety: Secondary | ICD-10-CM | POA: Diagnosis not present

## 2024-01-10 DIAGNOSIS — F411 Generalized anxiety disorder: Secondary | ICD-10-CM | POA: Diagnosis not present

## 2024-01-15 DIAGNOSIS — F4322 Adjustment disorder with anxiety: Secondary | ICD-10-CM | POA: Diagnosis not present

## 2024-01-15 DIAGNOSIS — F411 Generalized anxiety disorder: Secondary | ICD-10-CM | POA: Diagnosis not present

## 2024-01-23 DIAGNOSIS — F411 Generalized anxiety disorder: Secondary | ICD-10-CM | POA: Diagnosis not present

## 2024-01-23 DIAGNOSIS — F4322 Adjustment disorder with anxiety: Secondary | ICD-10-CM | POA: Diagnosis not present

## 2024-01-28 DIAGNOSIS — F4322 Adjustment disorder with anxiety: Secondary | ICD-10-CM | POA: Diagnosis not present

## 2024-01-28 DIAGNOSIS — F411 Generalized anxiety disorder: Secondary | ICD-10-CM | POA: Diagnosis not present

## 2024-02-01 DIAGNOSIS — F411 Generalized anxiety disorder: Secondary | ICD-10-CM | POA: Diagnosis not present

## 2024-02-01 DIAGNOSIS — F4322 Adjustment disorder with anxiety: Secondary | ICD-10-CM | POA: Diagnosis not present

## 2024-02-09 DIAGNOSIS — F411 Generalized anxiety disorder: Secondary | ICD-10-CM | POA: Diagnosis not present

## 2024-02-09 DIAGNOSIS — F4322 Adjustment disorder with anxiety: Secondary | ICD-10-CM | POA: Diagnosis not present

## 2024-02-25 ENCOUNTER — Ambulatory Visit: Payer: Self-pay

## 2024-02-25 NOTE — Telephone Encounter (Signed)
 FYI Only or Action Required?: FYI only for provider: appointment scheduled on 03/02/24.  Patient was last seen in primary care on 08/27/2023 by Joyce Norleen BROCKS, MD.  Called Nurse Triage reporting Rectal Pain.  Symptoms began several months ago.  Interventions attempted: Ice/heat application.  Symptoms are: unchanged.  Triage Disposition: See PCP When Office is Open (Within 3 Days)  Patient/caregiver understands and will follow disposition?: Yes Reason for Disposition  [1] Home treatment > 3 days for rectal pain AND [2] not improved  Answer Assessment - Initial Assessment Questions Getting colonoscopy in March. Does not use any medications in that area, has tried ice and heat, no relief. Reports it feeling like a bruise, but has done nothing to have a bruise in that area. Denies discoloration there. When water hits area in the shower, experiences pain. No available appointments within dispo, patient wanted appointment Tuesday 1/6  1. SYMPTOM:  What's the main symptom you're concerned about? (e.g., pain, itching, swelling, rash)     Pain at the top of buttocks, rectal area   2. ONSET: When did the pain start?     2 months, comes and goes  3. RECTAL PAIN: Do you have any pain around your rectum? How bad is the pain?  (Scale 0-10; or none, mild, moderate, severe)     8/10, hurts to sit or lay down, fine when up walking  4. RECTAL ITCHING: Do you have any itching in this area? How bad is the itching?  (Scale 0-10; or none, mild, moderate, severe)     Denies  5. CONSTIPATION: Do you have constipation? If Yes, ask: How often do you have a bowel movement (BM)?  (Normal range: 3 times a day to every 3 days)  When was your last BM?       Chronic constipation, seeing GI. Last BM yesterday, not painful to have a BM.   6. CAUSE: What do you think is causing the anus symptoms?     Unsure, hx of hemorrhoids, seen in ED 10/20/23, dx with internal hemorrhoids   7. OTHER SYMPTOMS:  Do you have any other symptoms?  (e.g., abdomen pain, fever, rectal bleeding, vomiting)     Blood in stool every so often.  Protocols used: Rectal Symptoms-A-AH  Copied from CRM [id number]. Topic: Clinical - Red Word Triage >> Feb 25, 2024 11:21 AM Tinnie BROCKS wrote: Red Word that prompted transfer to Nurse Triage: For about a month, pt has been having severe pain towards top of butt crack. Extreme pain when lying down or when it is touched, but declines warmth in the area or anything visible. Wants to be seen with anyone for as soon as possible. Next avail acute visit 1/5 with PFMS.

## 2024-03-02 ENCOUNTER — Other Ambulatory Visit (HOSPITAL_COMMUNITY): Payer: Self-pay

## 2024-03-02 ENCOUNTER — Ambulatory Visit: Admitting: Nurse Practitioner

## 2024-03-02 ENCOUNTER — Other Ambulatory Visit: Payer: Self-pay

## 2024-03-02 ENCOUNTER — Encounter: Payer: Self-pay | Admitting: Nurse Practitioner

## 2024-03-02 VITALS — BP 116/74 | HR 114 | Wt 125.0 lb

## 2024-03-02 DIAGNOSIS — K649 Unspecified hemorrhoids: Secondary | ICD-10-CM

## 2024-03-02 DIAGNOSIS — R198 Other specified symptoms and signs involving the digestive system and abdomen: Secondary | ICD-10-CM

## 2024-03-02 DIAGNOSIS — K6289 Other specified diseases of anus and rectum: Secondary | ICD-10-CM

## 2024-03-02 MED ORDER — LIDOCAINE 5 % EX OINT
1.0000 | TOPICAL_OINTMENT | Freq: Four times a day (QID) | CUTANEOUS | 0 refills | Status: AC | PRN
  Filled 2024-03-02: qty 35.44, 9d supply, fill #0

## 2024-03-02 MED ORDER — IBUPROFEN 100 MG/5ML PO SUSP
600.0000 mg | Freq: Two times a day (BID) | ORAL | 0 refills | Status: AC | PRN
  Filled 2024-03-02: qty 273, 3d supply, fill #0

## 2024-03-02 MED ORDER — AMOXICILLIN-POT CLAVULANATE 400-57 MG/5ML PO SUSR
10.0000 mL | Freq: Two times a day (BID) | ORAL | 0 refills | Status: AC
  Filled 2024-03-02: qty 100, 5d supply, fill #0

## 2024-03-02 MED ORDER — NITROGLYCERIN 0.4 % RE OINT
TOPICAL_OINTMENT | RECTAL | 1 refills | Status: AC
  Filled 2024-03-02: qty 30, 30d supply, fill #0

## 2024-03-02 NOTE — Patient Instructions (Addendum)
 I have sent in suppositories to help shrink the hemorrhoids and reduce pain and spasms. I also sent in a cream to help with the pain associated.   I recommend contacting the GI doctor as soon as possible to see if they can get you in for evaluation and rubber banding. Because of the pain and the discharge you discussed, I sent in Augmentin  (antibiotic) for you to take twice a day.   I also sent in ibuprofen  600mg  for you to take at bedtime to help with pain at night. Try also using a pillow between the knees and an ice pack before bed to see if this helps with the pain.

## 2024-03-02 NOTE — Assessment & Plan Note (Addendum)
 Recurrent internal hemorrhoids with significant pain, bleeding, and discharge noted over the last week. This is a recurrent issue. Symptoms include severe pain interrupting sleep, inability to sit or lay down comfortably, and chills. Examination reveals no external hemorrhoids or signs of infection, but concern for possible infection due to chills and discharge noted by patient. Unable to rule out infection presence at this time. No rectal bleeding on exam. Previous treatments with hydrocortisone  cream and Lactulose  have been ineffective. Discussed potential for banding or surgical intervention with gastroenterologist. Strongly encouraged patient to reach out to GI for further evaluation and recommendations. Given current symptoms, discussed prophylactic start of antibiotic therapy. Will prescribe nitroglycerin  ointment for internal hemorrhoid relief and spasm reduction as well as topical lidocaine  for pain management. Recommend ibuprofen  at bedtime for pain and consider ice pack. Patient unable to swallow tablets, therefore liquid medication prescribed today.  - Prescribed amoxicillin  liquid for potential infection. - Prescribed nitroglycerin  ointment for pain relief. - Prescribed lidocaine  cream for topical numbing. - Prescribed liquid ibuprofen  for pain and swelling. - Advised follow-up with gastroenterologist for potential banding or surgical intervention. - Recommended using ice packs and positioning techniques to alleviate discomfort. Orders:   lidocaine  (XYLOCAINE ) 5 % ointment; Apply 1 Application topically 4 (four) times daily as needed (hemorrhoid pain).   Nitroglycerin  0.4 % OINT; Apply 1 inch (375 mg) ointment intra-anally every 12 hours for internal hemorrhoids   amoxicillin -clavulanate (AUGMENTIN ) 400-57 MG/5ML suspension; Take 10 mLs by mouth 2 (two) times daily.   ibuprofen  (ADVIL ) 100 MG/5ML suspension; Take 30 mLs (600 mg total) by mouth 3 times/day as needed-between meals & bedtime  (rectal pain).

## 2024-03-02 NOTE — Progress Notes (Signed)
 "  Rebecca Griffin Doing, DNP, AGNP-c [facility] [address] [place], [place] [address] [phone]   ACUTE VISIT : Acute or New Concern Visit on 03/02/2024  Blood pressure 116/74, pulse (!) 114, weight 125 lb (56.7 kg).   Subjective:  other (Rectal pain hurts when sitting and laying down comes and goes, mom tried squeeze hemorrhoids they are internal usually, had rectal bleeding last time went to the ED, )  Rebecca Griffin is a 24 year old female who presents with severe pain and rectal pressure due to internal hemorrhoids.  She has been experiencing complications from internal hemorrhoids for over a year with symptom recurrence in the past week, characterized by severe pain that prevents her from sitting or lying down comfortably. Typically the pain and bleeding is intermittent, lasting from a couple of weeks to a month before subsiding, only to return again. It is particularly intense at the top of her buttocks and radiates to the sides, which is different than usual.   She has a history of rectal bleeding, which was severe enough to warrant a visit to the emergency room in the past. During that visit, surgery was discussed, but she did not receive a follow-up with a careers adviser. She has scheduled a colonoscopy for March. She is followed with GI who has offered in office banding/procedure for hemorrhoid management in the past. She has been using hydrocortisone  cream internally, but it has not provided relief. She also uses lactulose  for constipation, which has provided some relief but not completely resolved the issue.  Her symptoms have significantly impacted her sleep, as she has been unable to sleep for the past three days due to the pain, waking up frequently. She has tried Tylenol for pain relief, taking 1000 mg, but continues to experience discomfort.  She reports experiencing chills over the past week, feeling unusually cold, and has noticed a yellow discharge with bowel  movements for the past couple of weeks. She also experiences occasional stomach pain.  ROS negative except for what is listed in HPI. History, Medications, Surgery, SDOH, and Family History reviewed and updated as appropriate.  Objective:  Physical Exam Vitals and nursing note reviewed.  Constitutional:      General: She is not in acute distress.    Appearance: Normal appearance. She is not ill-appearing or toxic-appearing.  Eyes:     General: No scleral icterus.    Pupils: Pupils are equal, round, and reactive to light.  Cardiovascular:     Rate and Rhythm: Normal rate and regular rhythm.     Pulses: Normal pulses.     Heart sounds: Normal heart sounds.  Pulmonary:     Effort: Pulmonary effort is normal.  Abdominal:     General: Bowel sounds are normal. There is no distension.     Palpations: Abdomen is soft.     Tenderness: There is no abdominal tenderness. There is no guarding.  Genitourinary:    Exam position: Knee-chest position.     Comments: Rectum normal in appearance with no erythema, swelling, or bleeding present. No evidence of prolapse or hemorrhoids externally present. Gluteal area appears normal with no signs of inflammation or infection.  Skin:    General: Skin is warm and dry.     Capillary Refill: Capillary refill takes less than 2 seconds.  Neurological:     Mental Status: She is alert and oriented to person, place, and time.     Motor: No weakness.  Assessment & Plan:   Assessment & Plan Hemorrhoids, unspecified hemorrhoid type Rectal discharge Anal or rectal pain Recurrent internal hemorrhoids with significant pain, bleeding, and discharge noted over the last week. This is a recurrent issue. Symptoms include severe pain interrupting sleep, inability to sit or lay down comfortably, and chills. Examination reveals no external hemorrhoids or signs of infection, but concern for possible infection due to chills and discharge noted by patient. Unable to  rule out infection presence at this time. No rectal bleeding on exam. Previous treatments with hydrocortisone  cream and Lactulose  have been ineffective. Discussed potential for banding or surgical intervention with gastroenterologist. Strongly encouraged patient to reach out to GI for further evaluation and recommendations. Given current symptoms, discussed prophylactic start of antibiotic therapy. Will prescribe nitroglycerin  ointment for internal hemorrhoid relief and spasm reduction as well as topical lidocaine  for pain management. Recommend ibuprofen  at bedtime for pain and consider ice pack. Patient unable to swallow tablets, therefore liquid medication prescribed today.  - Prescribed amoxicillin  liquid for potential infection. - Prescribed nitroglycerin  ointment for pain relief. - Prescribed lidocaine  cream for topical numbing. - Prescribed liquid ibuprofen  for pain and swelling. - Advised follow-up with gastroenterologist for potential banding or surgical intervention. - Recommended using ice packs and positioning techniques to alleviate discomfort. Orders:   lidocaine  (XYLOCAINE ) 5 % ointment; Apply 1 Application topically 4 (four) times daily as needed (hemorrhoid pain).   Nitroglycerin  0.4 % OINT; Apply 1 inch (375 mg) ointment intra-anally every 12 hours for internal hemorrhoids   amoxicillin -clavulanate (AUGMENTIN ) 400-57 MG/5ML suspension; Take 10 mLs by mouth 2 (two) times daily.   ibuprofen  (ADVIL ) 100 MG/5ML suspension; Take 30 mLs (600 mg total) by mouth 3 times/day as needed-between meals & bedtime (rectal pain).    Lymon Kidney E Eilleen Davoli, DNP, AGNP-c       "

## 2024-03-03 ENCOUNTER — Other Ambulatory Visit (HOSPITAL_COMMUNITY): Payer: Self-pay

## 2024-03-03 ENCOUNTER — Other Ambulatory Visit: Payer: Self-pay

## 2024-03-04 ENCOUNTER — Other Ambulatory Visit: Payer: Self-pay

## 2024-03-04 ENCOUNTER — Other Ambulatory Visit (HOSPITAL_COMMUNITY): Payer: Self-pay

## 2024-04-01 ENCOUNTER — Other Ambulatory Visit: Payer: Self-pay

## 2024-04-01 ENCOUNTER — Other Ambulatory Visit (HOSPITAL_COMMUNITY): Payer: Self-pay
